# Patient Record
Sex: Female | Born: 1995 | Race: White | Hispanic: No | Marital: Single | Smoking: Never smoker
Health system: Southern US, Community
[De-identification: ages and names within clinical notes are randomized; demographics above are authoritative.]

## PROBLEM LIST (undated history)

## (undated) DIAGNOSIS — D509 Iron deficiency anemia, unspecified: Secondary | ICD-10-CM

## (undated) DIAGNOSIS — R7881 Bacteremia: Secondary | ICD-10-CM

## (undated) DIAGNOSIS — A419 Sepsis, unspecified organism: Secondary | ICD-10-CM

## (undated) DIAGNOSIS — H209 Unspecified iridocyclitis: Secondary | ICD-10-CM

## (undated) DIAGNOSIS — J189 Pneumonia, unspecified organism: Secondary | ICD-10-CM

## (undated) DIAGNOSIS — E871 Hypo-osmolality and hyponatremia: Secondary | ICD-10-CM

## (undated) DIAGNOSIS — E43 Unspecified severe protein-calorie malnutrition: Secondary | ICD-10-CM

## (undated) DIAGNOSIS — Z87898 Personal history of other specified conditions: Secondary | ICD-10-CM

## (undated) DIAGNOSIS — J939 Pneumothorax, unspecified: Secondary | ICD-10-CM

## (undated) DIAGNOSIS — J9600 Acute respiratory failure, unspecified whether with hypoxia or hypercapnia: Secondary | ICD-10-CM

## (undated) DIAGNOSIS — B370 Candidal stomatitis: Secondary | ICD-10-CM

## (undated) HISTORY — DX: Sepsis, unspecified organism: A41.9

## (undated) HISTORY — DX: Bacteremia: R78.81

## (undated) HISTORY — DX: Hypo-osmolality and hyponatremia: E87.1

## (undated) HISTORY — DX: Iron deficiency anemia, unspecified: D50.9

## (undated) HISTORY — DX: Pneumothorax, unspecified: J93.9

## (undated) HISTORY — DX: Personal history of other specified conditions: Z87.898

## (undated) HISTORY — DX: Pneumonia, unspecified organism: J18.9

## (undated) HISTORY — DX: Candidal stomatitis: B37.0

## (undated) HISTORY — DX: Acute respiratory failure, unspecified whether with hypoxia or hypercapnia: J96.00

## (undated) HISTORY — PX: CHEST TUBE INSERTION: SHX231

## (undated) HISTORY — DX: Unspecified severe protein-calorie malnutrition: E43

## (undated) HISTORY — PX: WISDOM TOOTH EXTRACTION: SHX21

---
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
## 2014-02-24 DIAGNOSIS — E871 Hypo-osmolality and hyponatremia: Secondary | ICD-10-CM | POA: Insufficient documentation

## 2014-02-24 DIAGNOSIS — E43 Unspecified severe protein-calorie malnutrition: Secondary | ICD-10-CM | POA: Insufficient documentation

## 2014-02-24 DIAGNOSIS — B370 Candidal stomatitis: Secondary | ICD-10-CM

## 2014-02-24 DIAGNOSIS — Z8639 Personal history of other endocrine, nutritional and metabolic disease: Secondary | ICD-10-CM

## 2014-02-24 DIAGNOSIS — R7881 Bacteremia: Secondary | ICD-10-CM

## 2014-02-24 DIAGNOSIS — J9600 Acute respiratory failure, unspecified whether with hypoxia or hypercapnia: Secondary | ICD-10-CM

## 2014-02-24 DIAGNOSIS — A419 Sepsis, unspecified organism: Secondary | ICD-10-CM

## 2014-02-24 DIAGNOSIS — J189 Pneumonia, unspecified organism: Secondary | ICD-10-CM

## 2014-02-24 DIAGNOSIS — Z87898 Personal history of other specified conditions: Secondary | ICD-10-CM

## 2014-02-24 DIAGNOSIS — D509 Iron deficiency anemia, unspecified: Secondary | ICD-10-CM | POA: Insufficient documentation

## 2014-02-24 HISTORY — DX: Candidal stomatitis: B37.0

## 2014-02-24 HISTORY — DX: Pneumonia, unspecified organism: J18.9

## 2014-02-24 HISTORY — DX: Bacteremia: R78.81

## 2014-02-24 HISTORY — DX: Unspecified severe protein-calorie malnutrition: E43

## 2014-02-24 HISTORY — DX: Personal history of other endocrine, nutritional and metabolic disease: Z86.39

## 2014-02-24 HISTORY — DX: Hypo-osmolality and hyponatremia: E87.1

## 2014-02-24 HISTORY — DX: Sepsis, unspecified organism: A41.9

## 2014-02-24 HISTORY — DX: Acute respiratory failure, unspecified whether with hypoxia or hypercapnia: J96.00

## 2014-02-24 HISTORY — DX: Iron deficiency anemia, unspecified: D50.9

## 2014-02-24 HISTORY — DX: Personal history of other specified conditions: Z87.898

## 2014-03-15 ENCOUNTER — Encounter: Payer: Self-pay | Admitting: Licensed Clinical Social Worker

## 2014-03-15 ENCOUNTER — Ambulatory Visit (INDEPENDENT_AMBULATORY_CARE_PROVIDER_SITE_OTHER): Payer: 59 | Admitting: Internal Medicine

## 2014-03-15 VITALS — BP 112/79 | HR 116 | Temp 97.5°F | Wt 91.8 lb

## 2014-03-15 DIAGNOSIS — B9562 Methicillin resistant Staphylococcus aureus infection as the cause of diseases classified elsewhere: Secondary | ICD-10-CM

## 2014-03-15 DIAGNOSIS — R7881 Bacteremia: Secondary | ICD-10-CM

## 2014-03-15 NOTE — Progress Notes (Signed)
Patient ID: Anna MaeCatherine P Stenzel, female   DOB: 07/20/1995, 18 y.o.   MRN: [id number]         [facility]  Reason for Consult: MRSA pneumonia and bacteremia Referring Physician: Dr. Valma CavaPhilip Yeon  Patient Active Problem List   Diagnosis Date Noted  . Pneumonia 02/24/2014    Priority: High  . MRSA bacteremia 02/24/2014    Priority: High  . Hx of cystic fibrosis 02/24/2014    Priority: Medium  . Microcytic anemia 02/24/2014    Patient's Medications  New Prescriptions   No medications on file  Previous Medications   ALBUTEROL (PROVENTIL HFA;VENTOLIN HFA) 108 (90 BASE) MCG/ACT INHALER    Inhale 2 puffs into the lungs 2 (two) times daily.   AMYLASE-LIPASE-PROTEASE (LIPRAM-CR10) 33.06-30-35.5 MU PER CAPSULE    Take 4 capsules by mouth 3 (three) times daily with meals.   ASCORBIC ACID (VITAMIN C PO)    Take by mouth as directed.   AZITHROMYCIN (ZITHROMAX) 500 MG TABLET    Take 1,000 mg by mouth 3 (three) times a week.   CHOLECALCIFEROL (VITAMIN D PO)    Take by mouth as directed.   DIFLUPREDNATE (DUREZOL) 0.05 % EMUL    Administer 1 drop to both eyes every other day. Frequency:QOD   Dosage:0.0     Instructions:  Note:Dose: 0.05 % BOTH EYES   DORNASE ALPHA (PULMOZYME) 1 MG/ML NEBULIZER SOLUTION    Take 2.5 mg by nebulization daily.   NYSTATIN (MYCOSTATIN) 100000 UNIT/ML SUSPENSION    Use as directed 5 mLs in the mouth or throat 4 (four) times daily. Swish and swallow   PANCRELIPASE, LIP-PROT-AMYL, (CREON PO)    Take by mouth as directed.   PEDIATRIC MULTIPLE VIT-C-FA (PEDIATRIC MULTIVITAMIN) CHEWABLE TABLET    Chew 1 tablet by mouth daily.   RIFAMPIN (RIFADIN) 300 MG CAPSULE    Take 300 mg by mouth 2 (two) times daily. For 4 weeks   SODIUM CHLORIDE HYPERTONIC 3 % NEBULIZER SOLUTION    Take 5 mLs by nebulization 2 (two) times daily.   TOBRAMYCIN IN    Inhale into the lungs.   VANCOMYCIN 1,000 MG IN SODIUM CHLORIDE 0.9 % 250 ML    Inject 1,000 mg into the vein  every 12 (twelve) hours. For 4 weeks.   VITAMIN A PO    Take by mouth as directed.   VITAMIN E PO    Take by mouth as directed.   VITAMIN K PO    Take by mouth as directed.  Modified Medications   No medications on file  Discontinued Medications   No medications on file    Recommendations: 1. Continue vancomycin and rifampin 2 more days then have the PICC removed 2. She may return to school 03/21/14   Assessment: She has improved on therapy for MRSA pneumonia and bacteremia. She will complete therapy on 03/17/14 and may return to school.   HPI: Anna Harper is a 18 y.o. female with cystic fibrosis who developed progressive weakness and subjective fevers in late September leading to admission to the hospital on 02[place]17/14 [place] she is at [facility] at [employer]. She was found to have pneumonia by CXR and CT and MRSA bacteremia. It appears that repeat BC were negative and TEE did not reveal evidence of endocarditis. She was discharged on 02/24/14 on IV vancomycin and oral rifampin. She has tolerated her antibiotics and PICC well and is feeling much better (back to her baseline).   Review  of Systems: Constitutional: positive for fevers, negative for anorexia, chills, sweats and weight loss Eyes: negative Ears, nose, mouth, throat, and face: negative Respiratory: positive for mild worsening of cough when she was first sick, negative for dyspnea on exertion and pleurisy/chest pain Cardiovascular: negative Gastrointestinal: negative Genitourinary:negative Integument/breast: negative    Past Medical History  Diagnosis Date  . Uveitis   . Cystic fibrosis with pulmonary manifestations   . Pneumonia 02/24/14    MULTIFOCAL  . Sepsis due to pneumonia 02/24/14  . MRSA bacteremia 02/24/14  . Hx of cystic fibrosis 02/24/14  . Acute respiratory failure 02/24/14  . Hyponatremia 02/24/14  . Severe protein-calorie malnutrition 02/24/14  . Microcytic anemia 02/24/14  . Oral thrush  02/24/14  . Pneumothorax, left     History  Substance Use Topics  . Smoking status: Not on file  . Smokeless tobacco: Not on file  . Alcohol Use:     No family history on file. No Known Allergies  OBJECTIVE: Blood pressure 112/79, pulse 116, temperature 97.5 F (36.4 C), temperature source Oral, weight 91 lb 12 oz (41.618 kg). General: thin female in no distress Skin: no rash Lungs: clear Cor: tachycardic but reg S1 and S2 without murmurs Abdomen: soft and nontender   Microbiology: No results found for this or any previous visit (from the past 240 hour(s)).  Cliffton AstersJohn Shatonia Hoots, MD [facility] [phone] pager   [phone] cell 03/15/2014, 11:37 AM

## 2015-12-12 ENCOUNTER — Encounter (HOSPITAL_COMMUNITY): Payer: Self-pay | Admitting: Emergency Medicine

## 2015-12-12 ENCOUNTER — Inpatient Hospital Stay (HOSPITAL_COMMUNITY)
Admission: EM | Admit: 2015-12-12 | Discharge: 2015-12-18 | DRG: 871 | Disposition: A | Discharge status: Home Health | Payer: 59 | Attending: Internal Medicine | Admitting: Internal Medicine

## 2015-12-12 ENCOUNTER — Emergency Department (HOSPITAL_COMMUNITY): Payer: 59

## 2015-12-12 DIAGNOSIS — D509 Iron deficiency anemia, unspecified: Secondary | ICD-10-CM | POA: Diagnosis present

## 2015-12-12 DIAGNOSIS — Z681 Body mass index (BMI) 19 or less, adult: Secondary | ICD-10-CM

## 2015-12-12 DIAGNOSIS — R0602 Shortness of breath: Secondary | ICD-10-CM | POA: Diagnosis not present

## 2015-12-12 DIAGNOSIS — A419 Sepsis, unspecified organism: Secondary | ICD-10-CM | POA: Diagnosis not present

## 2015-12-12 DIAGNOSIS — J690 Pneumonitis due to inhalation of food and vomit: Secondary | ICD-10-CM | POA: Diagnosis present

## 2015-12-12 DIAGNOSIS — F419 Anxiety disorder, unspecified: Secondary | ICD-10-CM | POA: Diagnosis present

## 2015-12-12 DIAGNOSIS — E43 Unspecified severe protein-calorie malnutrition: Secondary | ICD-10-CM | POA: Diagnosis present

## 2015-12-12 DIAGNOSIS — B370 Candidal stomatitis: Secondary | ICD-10-CM | POA: Diagnosis present

## 2015-12-12 DIAGNOSIS — J189 Pneumonia, unspecified organism: Secondary | ICD-10-CM

## 2015-12-12 DIAGNOSIS — J9601 Acute respiratory failure with hypoxia: Secondary | ICD-10-CM | POA: Diagnosis present

## 2015-12-12 DIAGNOSIS — Z79899 Other long term (current) drug therapy: Secondary | ICD-10-CM

## 2015-12-12 LAB — CBC WITH DIFFERENTIAL/PLATELET
BASOS ABS: 0 10*3/uL (ref 0.0–0.1)
Basophils Relative: 0 %
EOS ABS: 0 10*3/uL (ref 0.0–0.7)
Eosinophils Relative: 0 %
HCT: 39.8 % (ref 36.0–46.0)
HEMOGLOBIN: 12 g/dL (ref 12.0–15.0)
LYMPHS PCT: 14 %
Lymphs Abs: 1.9 10*3/uL (ref 0.7–4.0)
MCH: 20.9 pg — ABNORMAL LOW (ref 26.0–34.0)
MCHC: 30.2 g/dL (ref 30.0–36.0)
MCV: 69.2 fL — ABNORMAL LOW (ref 78.0–100.0)
MONO ABS: 1.4 10*3/uL — AB (ref 0.1–1.0)
Monocytes Relative: 10 %
NEUTROS PCT: 76 %
Neutro Abs: 10.5 10*3/uL — ABNORMAL HIGH (ref 1.7–7.7)
PLATELETS: ADEQUATE 10*3/uL (ref 150–400)
RBC: 5.75 MIL/uL — ABNORMAL HIGH (ref 3.87–5.11)
RDW: 16.8 % — ABNORMAL HIGH (ref 11.5–15.5)
SMEAR REVIEW: ADEQUATE
WBC: 13.8 10*3/uL — AB (ref 4.0–10.5)

## 2015-12-12 LAB — COMPREHENSIVE METABOLIC PANEL
ALBUMIN: 3.3 g/dL — AB (ref 3.5–5.0)
ALK PHOS: 83 U/L (ref 38–126)
ALT: 13 U/L — AB (ref 14–54)
ANION GAP: 11 (ref 5–15)
AST: 19 U/L (ref 15–41)
BUN: <5 mg/dL — ABNORMAL LOW (ref 6–20)
CALCIUM: 9.3 mg/dL (ref 8.9–10.3)
CHLORIDE: 96 mmol/L — AB (ref 101–111)
CO2: 25 mmol/L (ref 22–32)
CREATININE: 0.55 mg/dL (ref 0.44–1.00)
GFR calc Af Amer: >60 mL/min (ref >60)
GFR calc non Af Amer: >60 mL/min (ref >60)
GLUCOSE: 150 mg/dL — AB (ref 65–99)
Potassium: 4 mmol/L (ref 3.5–5.1)
SODIUM: 132 mmol/L — AB (ref 135–145)
Total Bilirubin: 0.4 mg/dL (ref 0.3–1.2)
Total Protein: 9 g/dL — ABNORMAL HIGH (ref 6.5–8.1)

## 2015-12-12 LAB — I-STAT CG4 LACTIC ACID, ED: Lactic Acid, Venous: 1.45 mmol/L (ref 0.5–1.9)

## 2015-12-12 MED ORDER — VANCOMYCIN HCL IN DEXTROSE 1-5 GM/200ML-% IV SOLN
1000.0000 mg | Freq: Once | INTRAVENOUS | Status: AC
  Administered 2015-12-13: 1000 mg via INTRAVENOUS
  Filled 2015-12-12: qty 200

## 2015-12-12 MED ORDER — SODIUM CHLORIDE 0.9 % IV BOLUS (SEPSIS)
500.0000 mL | Freq: Once | INTRAVENOUS | Status: AC
  Administered 2015-12-13: 500 mL via INTRAVENOUS

## 2015-12-12 MED ORDER — SODIUM CHLORIDE 0.9 % IV BOLUS (SEPSIS): 1000.0000 mL | Freq: Once | INTRAVENOUS | Status: DC

## 2015-12-12 MED ORDER — SODIUM CHLORIDE 0.9 % IV BOLUS (SEPSIS)
1000.0000 mL | Freq: Once | INTRAVENOUS | Status: AC
  Administered 2015-12-12: 1000 mL via INTRAVENOUS

## 2015-12-12 MED ORDER — DEXTROSE 5 % IV SOLN
2.0000 g | Freq: Once | INTRAVENOUS | Status: AC
  Administered 2015-12-13: 2 g via INTRAVENOUS
  Filled 2015-12-12: qty 2

## 2015-12-12 NOTE — ED Notes (Signed)
RT contacted for Sputum culture

## 2015-12-12 NOTE — ED Notes (Signed)
Provider at bedside

## 2015-12-12 NOTE — ED Triage Notes (Signed)
Mother reported fever today with emesis , left lateral ribcage " tightness" and  occasional productive cough . HR= 155/min at arrival.

## 2015-12-12 NOTE — ED Notes (Signed)
Pt had wisdom teeth removed on Wednesday, since removal, has had worsening shortness of breath with productive cough, L sided chest tightness, and fever. Hx of cystic fibrosis with "cultures of pseudomonas pneumonia" per mother. Pt has green phlegm with crackles in lower lobes. Sats 89%, which improve when coughing to 94%. Pt also reports anxiety with a "baseline heart rate of 130 when I'm in the hospital." Pt attempted to take tylenol pta, vomited medication up

## 2015-12-12 NOTE — ED Provider Notes (Signed)
[facility]-EMERGENCY DEPT Provider Note   CSN: [id number] Arrival date & time: 12/12/15  2127  First Provider Contact:  None    By signing my name below, I, Bethel Born, attest that this documentation has been prepared under the direction and in the presence of Derwood Kaplan, MD. Electronically Signed: Bethel Born, ED Scribe. 12/12/15. 11:40 PM   History   Chief Complaint Chief Complaint  Patient presents with  . Fever  . Emesis  . Chest Pain    HPI  The history is provided by the patient and a parent. No language interpreter was used.   Anna Harper is a 20 y.o. female with PMHx of cystic fibrosis who presents to the Emergency Department complaining of constant,  tight, left lateral chest pain with onset today. Pt had 4 wisdom teeth removed 6 days ago under light anaesthesia and her mother reports an intermittent low-grade fever since.  Associated symptoms include SOB, emesis after taking Tylenol and increased cough productive of green sputum. She used a breathing treatment at home with some improvement in congestion/SOB. She also took 500 mg of prophylactic azithromycin.  She was last admitted to the hospital 16 months ago and has not required any other abx since.  No history of DVT/PE. Pt denies hormonal therapy. Her pulmonology care is at [facility].   Past Medical History:  Diagnosis Date  . Acute respiratory failure (HCC) 02/24/14  . Cystic fibrosis with pulmonary manifestations (HCC)   . Hx of cystic fibrosis 02/24/14  . Hyponatremia 02/24/14  . Microcytic anemia 02/24/14  . MRSA bacteremia 02/24/14  . Oral thrush 02/24/14  . Pneumonia 02/24/14   MULTIFOCAL  . Pneumothorax, left   . Sepsis due to pneumonia (HCC) 02/24/14  . Severe protein-calorie malnutrition (HCC) 02/24/14  . Uveitis     Patient Active Problem List   Diagnosis Date Noted  . Pneumonia 02/24/2014  . MRSA bacteremia 02/24/2014  . Hx of cystic fibrosis 02/24/2014  . Microcytic anemia 02/24/2014     History reviewed. No pertinent surgical history.  OB History    No data available       Home Medications    Prior to Admission medications   Medication Sig Start Date End Date Taking? Authorizing Provider  albuterol (PROVENTIL HFA;VENTOLIN HFA) 108 (90 BASE) MCG/ACT inhaler Inhale 2 puffs into the lungs 2 (two) times daily.    Historical Provider, MD  amylase-lipase-protease (LIPRAM-CR10) 33.06-30-35.5 MU per capsule Take 4 capsules by mouth 3 (three) times daily with meals.    Historical Provider, MD  Ascorbic Acid (VITAMIN C PO) Take by mouth as directed.    Historical Provider, MD  azithromycin (ZITHROMAX) 500 MG tablet Take 1,000 mg by mouth 3 (three) times a week.    Historical Provider, MD  Cholecalciferol (VITAMIN D PO) Take by mouth as directed.    Historical Provider, MD  Difluprednate (DUREZOL) 0.05 % EMUL Administer 1 drop to both eyes every other day. Frequency:QOD   Dosage:0.0     Instructions:  Note:Dose: 0.05 % BOTH EYES 07/17/12   Historical Provider, MD  dornase alpha (PULMOZYME) 1 MG/ML nebulizer solution Take 2.5 mg by nebulization daily.    Historical Provider, MD  nystatin (MYCOSTATIN) 100000 UNIT/ML suspension Use as directed 5 mLs in the mouth or throat 4 (four) times daily. Swish and swallow    Historical Provider, MD  Pancrelipase, Lip-Prot-Amyl, (CREON PO) Take by mouth as directed.    Historical Provider, MD  Pediatric Multiple Vit-C-FA (PEDIATRIC MULTIVITAMIN) chewable tablet Chew  1 tablet by mouth daily.    Historical Provider, MD  sodium chloride HYPERTONIC 3 % nebulizer solution Take 5 mLs by nebulization 2 (two) times daily.    Historical Provider, MD  TOBRAMYCIN IN Inhale into the lungs.    Historical Provider, MD  VITAMIN A PO Take by mouth as directed.    Historical Provider, MD  VITAMIN E PO Take by mouth as directed.    Historical Provider, MD  VITAMIN K PO Take by mouth as directed.    Historical Provider, MD    Family History No family history  on file.  Social History Social History  Substance Use Topics  . Smoking status: Never Smoker  . Smokeless tobacco: Not on file  . Alcohol use No     Allergies   Review of patient's allergies indicates no known allergies.   Review of Systems Review of Systems  10 Systems reviewed and all are negative for acute change except as noted in the HPI.   Physical Exam Updated Vital Signs BP 110/74   Pulse (!) 136   Temp 99 F (37.2 C) (Oral)   Resp 19   Ht  (1.626 m)   Wt 94 lb 5 oz (42.8 kg)   SpO2 90%   BMI 16.19 kg/m   Physical Exam  Constitutional: She is oriented to person, place, and time. She appears well-developed and well-nourished. No distress.  HENT:  Head: Normocephalic and atraumatic.  Eyes: EOM are normal.  Neck: Normal range of motion.  Cardiovascular: Regular rhythm and normal heart sounds.  Tachycardia present.   Pulmonary/Chest: Effort normal. She has rales.  Inspiratory rales bilaterally (L>R)  Abdominal: Soft. She exhibits no distension. There is no tenderness.  Musculoskeletal: Normal range of motion.  Neurological: She is alert and oriented to person, place, and time.  Skin: Skin is warm and dry.  Psychiatric: She has a normal mood and affect. Judgment normal.  Nursing note and vitals reviewed.    ED Treatments / Results  Labs (all labs ordered are listed, but only abnormal results are displayed) Labs Reviewed  CBC WITH DIFFERENTIAL/PLATELET - Abnormal; Notable for the following:       Result Value   WBC 13.8 (*)    RBC 5.75 (*)    MCV 69.2 (*)    MCH 20.9 (*)    RDW 16.8 (*)    Neutro Abs 10.5 (*)    Monocytes Absolute 1.4 (*)    All other components within normal limits  COMPREHENSIVE METABOLIC PANEL - Abnormal; Notable for the following:    Sodium 132 (*)    Chloride 96 (*)    Glucose, Bld 150 (*)    BUN <5 (*)    Total Protein 9.0 (*)    Albumin 3.3 (*)    ALT 13 (*)    All other components within normal limits   CULTURE, BLOOD (ROUTINE X 2)  CULTURE, BLOOD (ROUTINE X 2)  URINE CULTURE  CULTURE, EXPECTORATED SPUTUM-ASSESSMENT  I-STAT CG4 LACTIC ACID, ED  POC URINE PREG, ED    EKG  EKG Interpretation  Date/Time:  Tuesday December 12 2015 22:03:45 EDT Ventricular Rate:  140 PR Interval:  126 QRS Duration: 80 QT Interval:  260 QTC Calculation: 396 R Axis:   91 Text Interpretation:  Sinus tachycardia Right atrial enlargement Rightward axis Nonspecific T wave abnormality Abnormal ECG No old tracing to compare Confirmed by Rhunette Croft, MD, Janey Genta [phone]) on 12/12/2015 11:11:05 PM       Radiology  Dg Chest 2 View  Result Date: 12/12/2015 CLINICAL DATA:  20 year old female with fever cough and left-sided chest pain. History of cystic fibrosis. EXAM: CHEST  2 VIEW COMPARISON:  Chest radiograph dated 11/16/2011 FINDINGS: There are chronic interstitial coarsening and prominence with areas of the cystic changes throughout the lungs compatible with chronic changes of cystic fibrosis. There has been interval increased in the diffuse interstitial coarsening and bronchiectatic changes are with wall thickened walls compared to the prior study. Superimposed pneumonia is not excluded. Clinical correlation is recommended. There is no focal consolidation. No pleural effusion or pneumothorax noted. The cardiac silhouette is within normal limits. No acute osseous pathology identified. IMPRESSION: Chronic changes of cystic fibrosis with interval progression of interstitial scarring and bronchiectatic changes compared to the prior study. No focal consolidation. Electronically Signed   By: Elgie Collard M.D.   On: 12/12/2015 22:37   Procedures Procedures (including critical care time)  Medications Ordered in ED Medications  sodium chloride 0.9 % bolus 500 mL (not administered)  ceFEPIme (MAXIPIME) 2 g in dextrose 5 % 50 mL IVPB (not administered)  vancomycin (VANCOCIN) IVPB 1000 mg/200 mL premix (not administered)   sodium chloride 0.9 % bolus 1,000 mL (1,000 mLs Intravenous New Bag/Given 12/12/15 2237)     Initial Impression / Assessment and Plan / ED Course  I have reviewed the triage vital signs and the nursing notes.  COORDINATION OF CARE: 11:21 PM Discussed treatment plan which includes lab work, CXR, EKG, abx, and admission to the hospital with pt at bedside and pt agreed to plan.  Pertinent labs & imaging results that were available during my care of the patient were reviewed by me and considered in my medical decision making (see chart for details).  Clinical Course    I personally performed the services described in this documentation, which was scribed in my presence. The recorded information has been reviewed and is accurate.   Pt comes in with cc of cough and L sided chest tightness. She has hx of CF. Uptodate with immunization and takes 500 mg azithromycin tid. 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 Pt reports recent dental extraction, for which she received moderate sedation. Pt has chronic cough, but the cough has gotten worse, and today she had L sided chest tightness, so she decided to come to the ER. Low grade temp at home 99-100 degrees. Pt is noted to be tachycardic, tachypneic and has WC of 14K. She has slightly decreased O2 sats. Clinical concerns for PNA, although xrays nt picking up. We will give her vanc and cefepime to cover  MRSA and pseudomonas. Lactate is 1.4.  Final Clinical Impressions(s) / ED Diagnoses   Final diagnoses:  CAP (community acquired pneumonia)  Sepsis, due to unspecified organism [facility])    New Prescriptions New Prescriptions   No medications on file      Derwood Kaplan, MD 12/12/15 2345

## 2015-12-13 ENCOUNTER — Encounter (HOSPITAL_COMMUNITY): Payer: Self-pay | Admitting: Internal Medicine

## 2015-12-13 DIAGNOSIS — J9601 Acute respiratory failure with hypoxia: Secondary | ICD-10-CM | POA: Diagnosis present

## 2015-12-13 DIAGNOSIS — Z681 Body mass index (BMI) 19 or less, adult: Secondary | ICD-10-CM | POA: Diagnosis not present

## 2015-12-13 DIAGNOSIS — J189 Pneumonia, unspecified organism: Secondary | ICD-10-CM | POA: Diagnosis not present

## 2015-12-13 DIAGNOSIS — B370 Candidal stomatitis: Secondary | ICD-10-CM | POA: Diagnosis present

## 2015-12-13 DIAGNOSIS — A419 Sepsis, unspecified organism: Secondary | ICD-10-CM | POA: Diagnosis present

## 2015-12-13 DIAGNOSIS — Z87898 Personal history of other specified conditions: Secondary | ICD-10-CM

## 2015-12-13 DIAGNOSIS — J9600 Acute respiratory failure, unspecified whether with hypoxia or hypercapnia: Secondary | ICD-10-CM | POA: Insufficient documentation

## 2015-12-13 DIAGNOSIS — R6510 Systemic inflammatory response syndrome (SIRS) of non-infectious origin without acute organ dysfunction: Secondary | ICD-10-CM | POA: Diagnosis not present

## 2015-12-13 DIAGNOSIS — J690 Pneumonitis due to inhalation of food and vomit: Secondary | ICD-10-CM | POA: Diagnosis not present

## 2015-12-13 DIAGNOSIS — E43 Unspecified severe protein-calorie malnutrition: Secondary | ICD-10-CM | POA: Diagnosis not present

## 2015-12-13 DIAGNOSIS — F419 Anxiety disorder, unspecified: Secondary | ICD-10-CM | POA: Diagnosis present

## 2015-12-13 DIAGNOSIS — D509 Iron deficiency anemia, unspecified: Secondary | ICD-10-CM | POA: Diagnosis not present

## 2015-12-13 DIAGNOSIS — R0602 Shortness of breath: Secondary | ICD-10-CM | POA: Diagnosis present

## 2015-12-13 DIAGNOSIS — Z79899 Other long term (current) drug therapy: Secondary | ICD-10-CM | POA: Diagnosis not present

## 2015-12-13 LAB — RESPIRATORY PANEL BY PCR
ADENOVIRUS-RVPPCR: NOT DETECTED
BORDETELLA PERTUSSIS-RVPCR: NOT DETECTED
CORONAVIRUS 229E-RVPPCR: NOT DETECTED
Chlamydophila pneumoniae: NOT DETECTED
Coronavirus HKU1: NOT DETECTED
Coronavirus NL63: NOT DETECTED
Coronavirus OC43: NOT DETECTED
INFLUENZA A H1 2009-RVPPR: NOT DETECTED
INFLUENZA A H1-RVPPCR: NOT DETECTED
Influenza A H3: NOT DETECTED
Influenza A: NOT DETECTED
Influenza B: NOT DETECTED
MYCOPLASMA PNEUMONIAE-RVPPCR: NOT DETECTED
Metapneumovirus: NOT DETECTED
Parainfluenza Virus 1: NOT DETECTED
Parainfluenza Virus 2: NOT DETECTED
Parainfluenza Virus 3: NOT DETECTED
Parainfluenza Virus 4: NOT DETECTED
RESPIRATORY SYNCYTIAL VIRUS-RVPPCR: NOT DETECTED
Rhinovirus / Enterovirus: NOT DETECTED

## 2015-12-13 LAB — APTT: aPTT: 40 seconds — ABNORMAL HIGH (ref 24–36)

## 2015-12-13 LAB — LACTIC ACID, PLASMA
Lactic Acid, Venous: 1 mmol/L (ref 0.5–1.9)
Lactic Acid, Venous: 0.7 mmol/L (ref 0.5–1.9)

## 2015-12-13 LAB — HIV ANTIBODY (ROUTINE TESTING W REFLEX): HIV SCREEN 4TH GENERATION: NONREACTIVE

## 2015-12-13 LAB — PREGNANCY, URINE: PREG TEST UR: NEGATIVE

## 2015-12-13 LAB — PROCALCITONIN: Procalcitonin: 0.18 ng/mL

## 2015-12-13 LAB — PROTIME-INR
INR: 1.38
Prothrombin Time: 17.1 seconds — ABNORMAL HIGH (ref 11.4–15.2)

## 2015-12-13 LAB — STREP PNEUMONIAE URINARY ANTIGEN: STREP PNEUMO URINARY ANTIGEN: NEGATIVE

## 2015-12-13 LAB — MRSA PCR SCREENING: MRSA by PCR: POSITIVE — AB

## 2015-12-13 MED ORDER — SODIUM CHLORIDE 7 % IN NEBU
1.0000 | INHALATION_SOLUTION | Freq: Two times a day (BID) | RESPIRATORY_TRACT | Status: DC
  Administered 2015-12-15 (×2): via RESPIRATORY_TRACT
  Administered 2015-12-16: 7 via RESPIRATORY_TRACT
  Administered 2015-12-16: 21:00:00 via RESPIRATORY_TRACT
  Administered 2015-12-17: 7 % via RESPIRATORY_TRACT
  Administered 2015-12-17: 20:00:00 via RESPIRATORY_TRACT
  Administered 2015-12-18: 1 via RESPIRATORY_TRACT
  Filled 2015-12-13 (×6): qty 1

## 2015-12-13 MED ORDER — CHLORHEXIDINE GLUCONATE CLOTH 2 % EX PADS
6.0000 | MEDICATED_PAD | Freq: Every day | CUTANEOUS | Status: AC
  Administered 2015-12-14 – 2015-12-18 (×5): 6 via TOPICAL

## 2015-12-13 MED ORDER — MUPIROCIN 2 % EX OINT
1.0000 "application " | TOPICAL_OINTMENT | Freq: Two times a day (BID) | CUTANEOUS | Status: AC
  Administered 2015-12-13 – 2015-12-18 (×10): 1 via NASAL
  Filled 2015-12-13 (×2): qty 22

## 2015-12-13 MED ORDER — TOBRAMYCIN 300 MG/5ML IN NEBU
300.0000 mg | INHALATION_SOLUTION | Freq: Two times a day (BID) | RESPIRATORY_TRACT | Status: DC
  Administered 2015-12-13 – 2015-12-15 (×6): 300 mg via RESPIRATORY_TRACT
  Filled 2015-12-13 (×10): qty 5

## 2015-12-13 MED ORDER — PANTOPRAZOLE SODIUM 40 MG PO TBEC
40.0000 mg | DELAYED_RELEASE_TABLET | Freq: Every day | ORAL | Status: DC
Start: 2015-12-13 — End: 2015-12-18
  Administered 2015-12-13 – 2015-12-18 (×6): 40 mg via ORAL
  Filled 2015-12-13 (×7): qty 1

## 2015-12-13 MED ORDER — AZITHROMYCIN 250 MG PO TABS
500.0000 mg | ORAL_TABLET | ORAL | Status: DC
  Administered 2015-12-13 – 2015-12-15 (×2): 500 mg via ORAL
  Filled 2015-12-13 (×2): qty 2

## 2015-12-13 MED ORDER — SODIUM CHLORIDE 0.9 % IV BOLUS (SEPSIS)
500.0000 mL | Freq: Once | INTRAVENOUS | Status: AC
  Administered 2015-12-14: 500 mL via INTRAVENOUS

## 2015-12-13 MED ORDER — PANCRELIPASE (LIP-PROT-AMYL) 36000-114000 UNITS PO CPEP
72000.0000 [IU] | ORAL_CAPSULE | ORAL | Status: DC | PRN
  Administered 2015-12-14 – 2015-12-16 (×5): 72000 [IU] via ORAL
  Filled 2015-12-13 (×4): qty 2

## 2015-12-13 MED ORDER — SODIUM CHLORIDE 0.9 % IV SOLN
INTRAVENOUS | Status: DC
  Administered 2015-12-13 – 2015-12-17 (×6): via INTRAVENOUS

## 2015-12-13 MED ORDER — ACETAMINOPHEN 325 MG PO TABS
650.0000 mg | ORAL_TABLET | Freq: Four times a day (QID) | ORAL | Status: DC | PRN
  Administered 2015-12-13 – 2015-12-16 (×6): 650 mg via ORAL
  Filled 2015-12-13 (×6): qty 2

## 2015-12-13 MED ORDER — SODIUM CHLORIDE 3 % IN NEBU
4.0000 mL | INHALATION_SOLUTION | Freq: Once | RESPIRATORY_TRACT | Status: AC
  Administered 2015-12-13: 4 mL via RESPIRATORY_TRACT
  Filled 2015-12-13: qty 4

## 2015-12-13 MED ORDER — ENSURE ENLIVE PO LIQD
237.0000 mL | Freq: Two times a day (BID) | ORAL | Status: DC
  Administered 2015-12-13: 237 mL via ORAL

## 2015-12-13 MED ORDER — AMYLASE-LIPASE-PROTEASE 33.2-10-37.5 MU PO CPEP: 4.0000 | ORAL_CAPSULE | Freq: Three times a day (TID) | ORAL | Status: DC

## 2015-12-13 MED ORDER — DM-GUAIFENESIN ER 30-600 MG PO TB12
1.0000 | ORAL_TABLET | Freq: Two times a day (BID) | ORAL | Status: DC
  Administered 2015-12-18: 1 via ORAL
  Filled 2015-12-13 (×7): qty 1

## 2015-12-13 MED ORDER — PIPERACILLIN-TAZOBACTAM 3.375 G IVPB
3.3750 g | Freq: Three times a day (TID) | INTRAVENOUS | Status: DC
  Administered 2015-12-13 – 2015-12-15 (×8): 3.375 g via INTRAVENOUS
  Filled 2015-12-13 (×11): qty 50

## 2015-12-13 MED ORDER — ONDANSETRON HCL 4 MG/2ML IJ SOLN
4.0000 mg | Freq: Three times a day (TID) | INTRAMUSCULAR | Status: DC | PRN
  Administered 2015-12-16: 4 mg via INTRAVENOUS
  Filled 2015-12-13: qty 2

## 2015-12-13 MED ORDER — DORNASE ALFA 2.5 MG/2.5ML IN SOLN
2.5000 mg | Freq: Every day | RESPIRATORY_TRACT | Status: DC
  Administered 2015-12-13 – 2015-12-18 (×6): 2.5 mg via RESPIRATORY_TRACT
  Filled 2015-12-13 (×8): qty 2.5

## 2015-12-13 MED ORDER — PANCRELIPASE (LIP-PROT-AMYL) 36000-114000 UNITS PO CPEP
120000.0000 [IU] | ORAL_CAPSULE | Freq: Three times a day (TID) | ORAL | Status: DC
  Administered 2015-12-13 – 2015-12-17 (×10): 120000 [IU] via ORAL
  Filled 2015-12-13 (×22): qty 3

## 2015-12-13 MED ORDER — ENOXAPARIN SODIUM 30 MG/0.3ML ~~LOC~~ SOLN
30.0000 mg | Freq: Every day | SUBCUTANEOUS | Status: DC
  Administered 2015-12-15: 30 mg via SUBCUTANEOUS
  Filled 2015-12-13 (×3): qty 0.3

## 2015-12-13 MED ORDER — FLUCONAZOLE 100 MG PO TABS
200.0000 mg | ORAL_TABLET | Freq: Every day | ORAL | Status: DC
  Administered 2015-12-13 – 2015-12-18 (×6): 200 mg via ORAL
  Filled 2015-12-13 (×6): qty 2

## 2015-12-13 MED ORDER — VANCOMYCIN HCL IN DEXTROSE 750-5 MG/150ML-% IV SOLN
750.0000 mg | Freq: Three times a day (TID) | INTRAVENOUS | Status: DC
  Administered 2015-12-13 – 2015-12-14 (×4): 750 mg via INTRAVENOUS
  Filled 2015-12-13 (×5): qty 150

## 2015-12-13 MED ORDER — LEVALBUTEROL HCL 1.25 MG/0.5ML IN NEBU
1.2500 mg | INHALATION_SOLUTION | Freq: Four times a day (QID) | RESPIRATORY_TRACT | Status: DC
Start: 2015-12-13 — End: 2015-12-18
  Administered 2015-12-13 – 2015-12-18 (×22): 1.25 mg via RESPIRATORY_TRACT
  Filled 2015-12-13 (×22): qty 0.5

## 2015-12-13 MED ORDER — SODIUM CHLORIDE 3 % IN NEBU
4.0000 mL | INHALATION_SOLUTION | Freq: Two times a day (BID) | RESPIRATORY_TRACT | Status: DC
  Administered 2015-12-13 – 2015-12-15 (×5): 4 mL via RESPIRATORY_TRACT
  Filled 2015-12-13 (×8): qty 4

## 2015-12-13 MED ORDER — SODIUM CHLORIDE 3 % IN NEBU: 5.0000 mL | INHALATION_SOLUTION | Freq: Two times a day (BID) | RESPIRATORY_TRACT | Status: DC

## 2015-12-13 NOTE — H&P (Signed)
History and Physical    Anna Harper POL:[id number] DOB: 24-Dec-1995 DOA: 12/12/2015  Referring MD/NP/PA:   PCP: Maxwell Caul, MD   Patient coming from:  The patient is coming from home.  At baseline, pt is independent for most of ADL.     Chief Complaint: Cough, shortness of breath and fever  HPI: Anna Harper is a 20 y.o. female with medical history significant of cystic fibrosis, pneumothorax, who presents with cough, shortness and chest pain.  Patient reports that she had 4 wisdom teeth removed on Wednesday. Since removal, she developed worsening shortness of breath with productive cough. She coughs up greenish colored sputum. She also has left-sided chest tightness which is mild. She has subjective fever and chills. She vomited twice due to severe coughing. Patient does not have abdominal pain, diarrhea, symptoms of UTI or unilateral weakness. Of note, she states that she always has anxiety with a "baseline heart rate of 130 when I'm in the hospital."   ED Course: pt was found to have WBC 13.8, M2 99, tachycardia, oxygen desaturation to 90% on room air, lactate 1.45, electrolytes and renal function okay. Chest x-ray showed chronic changes of cystic fibrosis with interval progression of interstitial scarring and bronchiectatic changes compared to the prior study, no focal consolidation. Pt is admitted to tele bed as inpt. EDP discussed with pt and her mother about possibility of transferring patient to [facility], but both patient and her mother refused.  Review of Systems:   General: has subjective fevers, chills, no changes in body weight, has fatigue HEENT: no blurry vision, hearing changes or sore throat Pulm: has dyspnea, coughing, no wheezing CV: has chest tightness, no palpitations Abd: had nausea, vomiting, no abdominal pain, diarrhea, constipation GU: no dysuria, burning on urination, increased urinary frequency, hematuria  Ext: no leg edema Neuro: no unilateral  weakness, numbness, or tingling, no vision change or hearing loss Skin: no rash MSK: No muscle spasm, no deformity, no limitation of range of movement in spin Heme: No easy bruising.  Travel history: No recent long distant travel.  Allergy: No Known Allergies  Past Medical History:  Diagnosis Date  . Acute respiratory failure (HCC) 02/24/14  . Cystic fibrosis with pulmonary manifestations (HCC)   . Hx of cystic fibrosis 02/24/14  . Hyponatremia 02/24/14  . Microcytic anemia 02/24/14  . MRSA bacteremia 02/24/14  . Oral thrush 02/24/14  . Pneumonia 02/24/14   MULTIFOCAL  . Pneumothorax, left   . Sepsis due to pneumonia (HCC) 02/24/14  . Severe protein-calorie malnutrition (HCC) 02/24/14  . Uveitis     Past Surgical History:  Procedure Laterality Date  . CHEST TUBE INSERTION Left   . WISDOM TOOTH EXTRACTION      Social History:  reports that she has never smoked. She does not have any smokeless tobacco history on file. She reports that she does not drink alcohol or use drugs.  Family History:  Family History  Problem Relation Age of Onset  . Autoimmune disease Mother   . Valvular heart disease Father   . Cystic fibrosis Sister   . Cystic fibrosis Brother      Prior to Admission medications   Medication Sig Start Date End Date Taking? Authorizing Provider  albuterol (PROVENTIL HFA;VENTOLIN HFA) 108 (90 BASE) MCG/ACT inhaler Inhale 2 puffs into the lungs 2 (two) times daily.    Historical Provider, MD  amylase-lipase-protease (LIPRAM-CR10) 33.06-30-35.5 MU per capsule Take 4 capsules by mouth 3 (three) times daily with meals.  Historical Provider, MD  Ascorbic Acid (VITAMIN C PO) Take by mouth as directed.    Historical Provider, MD  azithromycin (ZITHROMAX) 500 MG tablet Take 1,000 mg by mouth 3 (three) times a week.    Historical Provider, MD  Cholecalciferol (VITAMIN D PO) Take by mouth as directed.    Historical Provider, MD  Difluprednate (DUREZOL) 0.05 % EMUL Administer 1  drop to both eyes every other day. Frequency:QOD   Dosage:0.0     Instructions:  Note:Dose: 0.05 % BOTH EYES 07/17/12   Historical Provider, MD  dornase alpha (PULMOZYME) 1 MG/ML nebulizer solution Take 2.5 mg by nebulization daily.    Historical Provider, MD  nystatin (MYCOSTATIN) 100000 UNIT/ML suspension Use as directed 5 mLs in the mouth or throat 4 (four) times daily. Swish and swallow    Historical Provider, MD  Pancrelipase, Lip-Prot-Amyl, (CREON PO) Take by mouth as directed.    Historical Provider, MD  Pediatric Multiple Vit-C-FA (PEDIATRIC MULTIVITAMIN) chewable tablet Chew 1 tablet by mouth daily.    Historical Provider, MD  sodium chloride HYPERTONIC 3 % nebulizer solution Take 5 mLs by nebulization 2 (two) times daily.    Historical Provider, MD  TOBRAMYCIN IN Inhale into the lungs.    Historical Provider, MD  VITAMIN A PO Take by mouth as directed.    Historical Provider, MD  VITAMIN E PO Take by mouth as directed.    Historical Provider, MD  VITAMIN K PO Take by mouth as directed.    Historical Provider, MD    Physical Exam: Vitals:   12/12/15 2330 12/13/15 0030 12/13/15 0143 12/13/15 0256  BP: 106/72 103/69 106/63   Pulse: (!) 126 115 (!) 115   Resp: 23 20 16    Temp:   98.5 F (36.9 C)   TempSrc:   Oral   SpO2: 90% 92%  96%  Weight:   44.5 kg (98 lb 1.7 oz)   Height:   5\' 4"  (1.626 m)    General: Not in acute distress HEENT:       Eyes: PERRL, EOMI, no scleral icterus.       ENT: No discharge from the ears and nose, no pharynx injection, no tonsillar enlargement.        Neck: No JVD, no bruit, no mass felt. Heme: No neck lymph node enlargement. Cardiac: S1/S2, RRR, No murmurs, No gallops or rubs. Pulm: has rales bilaterally. No wheezing or rubs. Abd: Soft, nondistended, nontender, no rebound pain, no organomegaly, BS present. GU: No hematuria Ext: No pitting leg edema bilaterally. 2+DP/PT pulse bilaterally. Musculoskeletal: No joint deformities, No joint redness or  warmth, no limitation of ROM in spin. Skin: No rashes.  Neuro: Alert, oriented X3, cranial nerves II-XII grossly intact, moves all extremities normally. Psych: Patient is not psychotic, no suicidal or hemocidal ideation.  Labs on Admission: I have personally reviewed following labs and imaging studies  CBC:  Recent Labs Lab 12/12/15 2210  WBC 13.8*  NEUTROABS 10.5*  HGB 12.0  HCT 39.8  MCV 69.2*  PLT PLATELETS APPEAR ADEQUATE   Basic Metabolic Panel:  Recent Labs Lab 12/12/15 2210  NA 132*  K 4.0  CL 96*  CO2 25  GLUCOSE 150*  BUN <5*  CREATININE 0.55  CALCIUM 9.3   GFR: Estimated Creatinine Clearance: 78.1 mL/min (by C-G formula based on SCr of 0.8 mg/dL). Liver Function Tests:  Recent Labs Lab 12/12/15 2210  AST 19  ALT 13*  ALKPHOS 83  BILITOT 0.4  PROT 9.0*  ALBUMIN 3.3*   No results for input(s): LIPASE, AMYLASE in the last 168 hours. No results for input(s): AMMONIA in the last 168 hours. Coagulation Profile:  Recent Labs Lab 12/13/15 0118  INR 1.38   Cardiac Enzymes: No results for input(s): CKTOTAL, CKMB, CKMBINDEX, TROPONINI in the last 168 hours. BNP (last 3 results) No results for input(s): PROBNP in the last 8760 hours. HbA1C: No results for input(s): HGBA1C in the last 72 hours. CBG: No results for input(s): GLUCAP in the last 168 hours. Lipid Profile: No results for input(s): CHOL, HDL, LDLCALC, TRIG, CHOLHDL, LDLDIRECT in the last 72 hours. Thyroid Function Tests: No results for input(s): TSH, T4TOTAL, FREET4, T3FREE, THYROIDAB in the last 72 hours. Anemia Panel: No results for input(s): VITAMINB12, FOLATE, FERRITIN, TIBC, IRON, RETICCTPCT in the last 72 hours. Urine analysis: No results found for: COLORURINE, APPEARANCEUR, LABSPEC, PHURINE, GLUCOSEU, HGBUR, BILIRUBINUR, KETONESUR, PROTEINUR, UROBILINOGEN, NITRITE, LEUKOCYTESUR Sepsis Labs: (procalcitonin:4,lacticidven:4) )No results found for this or any previous  visit (from the past 240 hour(s)).   Radiological Exams on Admission: Dg Chest 2 View  Result Date: 12/12/2015 CLINICAL DATA:  20 year old female with fever cough and left-sided chest pain. History of cystic fibrosis. EXAM: CHEST  2 VIEW COMPARISON:  Chest radiograph dated 11/16/2011 FINDINGS: There are chronic interstitial coarsening and prominence with areas of the cystic changes throughout the lungs compatible with chronic changes of cystic fibrosis. There has been interval increased in the diffuse interstitial coarsening and bronchiectatic changes are with wall thickened walls compared to the prior study. Superimposed pneumonia is not excluded. Clinical correlation is recommended. There is no focal consolidation. No pleural effusion or pneumothorax noted. The cardiac silhouette is within normal limits. No acute osseous pathology identified. IMPRESSION: Chronic changes of cystic fibrosis with interval progression of interstitial scarring and bronchiectatic changes compared to the prior study. No focal consolidation. Electronically Signed   By: Elgie Collard M.D.   On: 12/12/2015 22:37    EKG: Independently reviewed.  Not done in ED, will get one.   Assessment/Plan Principal Problem:   Acute respiratory failure with hypoxia (HCC) Active Problems:   Hx of cystic fibrosis   Microcytic anemia   Sepsis (HCC)   Acute respiratory failure with hypoxia and sepsis: likely due to flare up of CF. Aspiration pneumonia is also possible given recent wisdom tooth removal. Pt meets criteria for sepsis with leukocytosis and tachycardia.  Lactate is normal. Hemodynamically stable.   -will admit to tele bed as inpt -IV cefepime and vanco were started in ED, will switch to vanco and zosyn given possible aspiration. -Sputum and blood cultures - check urine legionella and strep antigen -Mucinex for cough -prn Xopenex nebulizer for shortness of breath -continue home azithromycin and tobramycin  nebulizers -flutter valve -will get Procalcitonin and trend lactic acid levels per sepsis protocol. -IVF: 1.5L of NS bolus in ED, followed by 100 cc/h  -Consider routine pulmonary and infectious disease consults in the AM  Hx of cystic fibrosis: -see above  DVT ppx: SQ Lovenox Code Status: Full code Family Communication: Yes, patient's mother at bed side Disposition Plan:  Anticipate discharge back to previous home environment Consults called:  none Admission status: Inpatient/tele  Date of Service 12/13/2015    Lorretta Harp [facility] Pager [phone]  If 7PM-7AM, please contact night-coverage [email or web address] Password [email or web address] 12/13/2015, 3:04 AM

## 2015-12-13 NOTE — ED Notes (Signed)
Pt placed om 2L [place] oxygen for sats 86%

## 2015-12-13 NOTE — Progress Notes (Signed)
Received report from ED RN. Room ready for patient. Izola Teague Joselita, RN 

## 2015-12-13 NOTE — Progress Notes (Signed)
Pharmacy Antibiotic Note  RINKA JERSEY is a 20 y.o. female with h/o cystic fibrosis admitted on 12/12/2015 with PNA.  Pharmacy has been consulted for Vancomycin and Zosyn dosing.  Vancomycin 1 g IV given in ED at midnight   Plan: Vancomycin 750 mg IV q8h Zosyn 3.375 g IV q8h   Height: 5\' 4"  (162.6 cm) Weight: 98 lb 1.7 oz (44.5 kg) IBW/kg (Calculated) : 54.7  Temp (24hrs), Avg:98.8 F (37.1 C), Min:98.5 F (36.9 C), Max:99 F (37.2 C)   Recent Labs Lab 12/12/15 2210 12/12/15 2220 12/13/15 0118  WBC 13.8*  --   --   CREATININE 0.55  --   --   LATICACIDVEN  --  1.45 1.0    Estimated Creatinine Clearance: 78.1 mL/min (by C-G formula based on SCr of 0.8 mg/dL).    No Known Allergies  Eddie Candle 12/13/2015 3:17 AM

## 2015-12-13 NOTE — Progress Notes (Signed)
PROGRESS NOTE                                                                                                                                                                                                             Patient Demographics:    Anna Harper, is a 20 y.o. female, DOB - 10/19/1995, ZOX:[id number]  Admit date - 12/12/2015   Admitting Physician Lorretta Harp, MD  Outpatient Primary MD for the patient is Maxwell Caul, MD  LOS - 0  Outpatient Specialists: Dr. Camillo Flaming in [facility] cystic fibrosis  Chief Complaint  Patient presents with  . Fever  . Emesis  . Chest Pain       Brief Narrative   20 year old female with history of cystic fibrosis who follows at [facility], history of left pneumothorax and recently had all 4 wisdom tooth removed one week back presented to the ED with productive cough, shortness of breath and chest tightness in the past 3-4 days. She also reported subjective fever with chills. Patient had severe bouts of coughing with 2 episode of vomiting. Denied any headache, abdominal pain, diarrhea, dysuria. Patient was found to have SIRS with sinus tachycardia, WBC of 13.8 and O2 sat of 90% on room air. Remaining labs are stable. Chest excisional chronic cystic fibrosis changes with interval progression with interstitial scarring and bronchitic changes, no focal consolidation. Patient admitted to telemetry.   Subjective:   Patient reports her breathing to be better. Still having some cough. Heart rate in the 130s at rest.   Assessment  & Plan :    Principal Problem:   Acute respiratory failure with hypoxia (HCC) Possibly cystic fibrosis flareup versus aspiration pneumonia from recent wisdom teeth removal. Patient reports coughing and may have aspirated during the procedure. Currently requiring 3 L O2 via nasal cannula. Empiric IV vancomycin and Zosyn. Added azithromycin for atypical  coverage. Blood and sputum cultures sent on admission. Follow urine for strep antigen and Legionella. Added Mucinex. Tylenol when necessary for fever. Resume her home tobramycin nebulizer. Added Xopenex nebs. Resume home medications. If symptoms worsen I will also pulmonary.  Active Problems:   Hx of cystic fibrosis Resume hypertonic normal saline nebulizer solution twice daily. Resumed Creon, sodium chloride inhalant 7% bid. Resume Pulmozyme.  Sinus tachycardia Both patient and her mother report she has significant tachycardia during hospital  visit that potentially is triggered with anxiety. Heart is stable in the 130s. Continue IV hydration and monitor for now.  Oral thrush Added fluconazole     Microcytic anemia Stable.       Code Status : Full code  Family Communication  : Spoke with mother at bedside  Disposition Plan  : Home once clinically improved, possibly in the next 40-72 hours  Barriers For Discharge : Active symptoms  Consults  :  None  Procedures  : None  DVT Prophylaxis  :  Lovenox -   Lab Results  Component Value Date   PLT PLATELETS APPEAR ADEQUATE 12/12/2015    Antibiotics  :   Anti-infectives    Start     Dose/Rate Route Frequency Ordered Stop   12/13/15 1015  fluconazole (DIFLUCAN) tablet 200 mg     200 mg Oral Daily 12/13/15 1005     12/13/15 1000  azithromycin (ZITHROMAX) tablet 500 mg    Comments:  Monday, Wednesday and Friday     500 mg Oral Once per day on Mon Wed Fri 12/13/15 0057     12/13/15 0600  vancomycin (VANCOCIN) IVPB 750 mg/150 ml premix     750 mg 150 mL/hr over 60 Minutes Intravenous Every 8 hours 12/13/15 0321     12/13/15 0600  piperacillin-tazobactam (ZOSYN) IVPB 3.375 g     3.375 g 12.5 mL/hr over 240 Minutes Intravenous Every 8 hours 12/13/15 0321     12/13/15 0300  tobramycin (PF) (TOBI) nebulizer solution 300 mg    Comments:  Take for 30 days then hold for 30.     300 mg Nebulization 2 times daily 12/13/15 0057      12/12/15 2330  ceFEPIme (MAXIPIME) 2 g in dextrose 5 % 50 mL IVPB     2 g 100 mL/hr over 30 Minutes Intravenous  Once 12/12/15 2326 12/13/15 0100   12/12/15 2330  vancomycin (VANCOCIN) IVPB 1000 mg/200 mL premix     1,000 mg 200 mL/hr over 60 Minutes Intravenous  Once 12/12/15 2326 12/13/15 0200        Objective:   Vitals:   12/13/15 0256 12/13/15 0552 12/13/15 0725 12/13/15 0940  BP:  (!) 110/52  (!) 112/52  Pulse:  (!) 120  (!) 136  Resp:  16  18  Temp:  99.2 F (37.3 C)  98.8 F (37.1 C)  TempSrc:    Oral  SpO2: 96% 93% 91% 94%  Weight:      Height:        Wt Readings from Last 3 Encounters:  12/13/15 44.5 kg (98 lb 1.7 oz)  03/15/14 41.6 kg (91 lb 12 oz) (<1 %, Z < -2.33)*  11/16/11 38.4 kg (84 lb 11.2 oz) (<1 %, Z < -2.33)*   * Growth percentiles are based on CDC 2-20 Years data.     Intake/Output Summary (Last 24 hours) at 12/13/15 1518 Last data filed at 12/13/15 4098  Gross per 24 hour  Intake             1750 ml  Output             1050 ml  Net              700 ml     Physical Exam  Gen: not in distress HEENT: no pallor, moist mucosa, Oral thrush, supple neck Chest: Coarse breath sounds bilaterally CVS:  S1&S2 tachycardic, no murmurs, rubs or gallop GI: soft, NT, ND, BS+ Musculoskeletal:  warm, no edema     Data Review:    CBC  Recent Labs Lab 12/12/15 2210  WBC 13.8*  HGB 12.0  HCT 39.8  PLT PLATELETS APPEAR ADEQUATE  MCV 69.2*  MCH 20.9*  MCHC 30.2  RDW 16.8*  LYMPHSABS 1.9  MONOABS 1.4*  EOSABS 0.0  BASOSABS 0.0    Chemistries   Recent Labs Lab 12/12/15 2210  NA 132*  K 4.0  CL 96*  CO2 25  GLUCOSE 150*  BUN <5*  CREATININE 0.55  CALCIUM 9.3  AST 19  ALT 13*  ALKPHOS 83  BILITOT 0.4   ------------------------------------------------------------------------------------------------------------------ No results for input(s): CHOL, HDL, LDLCALC, TRIG, CHOLHDL, LDLDIRECT in the last 72 hours.  No results found  for: HGBA1C ------------------------------------------------------------------------------------------------------------------ No results for input(s): TSH, T4TOTAL, T3FREE, THYROIDAB in the last 72 hours.  Invalid input(s): FREET3 ------------------------------------------------------------------------------------------------------------------ No results for input(s): VITAMINB12, FOLATE, FERRITIN, TIBC, IRON, RETICCTPCT in the last 72 hours.  Coagulation profile  Recent Labs Lab 12/13/15 0118  INR 1.38    No results for input(s): DDIMER in the last 72 hours.  Cardiac Enzymes No results for input(s): CKMB, TROPONINI, MYOGLOBIN in the last 168 hours.  Invalid input(s): CK ------------------------------------------------------------------------------------------------------------------ No results found for: BNP  Inpatient Medications  Scheduled Meds: . azithromycin  500 mg Oral Once per day on Mon Wed Fri  . dextromethorphan-guaiFENesin  1 tablet Oral BID  . dornase alpha  2.5 mg Nebulization Daily  . enoxaparin (LOVENOX) injection  30 mg Subcutaneous Daily  . fluconazole  200 mg Oral Daily  . levalbuterol  1.25 mg Nebulization Q6H  . lipase/protease/amylase (CREON) 120,000 Units  120,000 Units Oral TID WC  . pantoprazole  40 mg Oral Daily  . piperacillin-tazobactam (ZOSYN)  IV  3.375 g Intravenous Q8H  . Sodium Chloride (Inhalant)  1 ampule Inhalation BID  . sodium chloride HYPERTONIC  4 mL Nebulization BID  . tobramycin (PF)  300 mg Nebulization BID  . vancomycin  750 mg Intravenous Q8H   Continuous Infusions: . sodium chloride 100 mL/hr at 12/13/15 1050   PRN Meds:.lipase/protease/amylase, ondansetron  Micro Results Recent Results (from the past 240 hour(s))  MRSA PCR Screening     Status: Abnormal   Collection Time: 12/13/15  2:13 AM  Result Value Ref Range Status   MRSA by PCR POSITIVE (A) NEGATIVE Final    Comment:        The GeneXpert MRSA Assay  (FDA approved for NASAL specimens only), is one component of a comprehensive MRSA colonization surveillance program. It is not intended to diagnose MRSA infection nor to guide or monitor treatment for MRSA infections. RESULT CALLED TO, READ BACK BY AND VERIFIED WITH: Jonny Ruiz @0826  12/13/15 [facility]     Radiology Reports Dg Chest 2 View  Result Date: 12/12/2015 CLINICAL DATA:  20 year old female with fever cough and left-sided chest pain. History of cystic fibrosis. EXAM: CHEST  2 VIEW COMPARISON:  Chest radiograph dated 11/16/2011 FINDINGS: There are chronic interstitial coarsening and prominence with areas of the cystic changes throughout the lungs compatible with chronic changes of cystic fibrosis. There has been interval increased in the diffuse interstitial coarsening and bronchiectatic changes are with wall thickened walls compared to the prior study. Superimposed pneumonia is not excluded. Clinical correlation is recommended. There is no focal consolidation. No pleural effusion or pneumothorax noted. The cardiac silhouette is within normal limits. No acute osseous pathology identified. IMPRESSION: Chronic changes of cystic fibrosis with interval progression of interstitial scarring and bronchiectatic  changes compared to the prior study. No focal consolidation. Electronically Signed   By: Elgie Collard M.D.   On: 12/12/2015 22:37   Time Spent in minutes  25   Eddie North M.D on 12/13/2015 at 3:18 PM  Between 7am to 7pm - Pager - [phone]  After 7pm go to [email or web address] - password [facility]  [facility] -  Office  [phone]

## 2015-12-13 NOTE — Progress Notes (Signed)
Patient with temp of 102.2. Text paged MD on call. Order received.Will continue to monitor. Ludivina Guymon, Drinda Butts, Charity fundraiser

## 2015-12-13 NOTE — Progress Notes (Signed)
Patient medications (Tobi, Hypertonic and Pulmozyme) were not available at this time. RN aware. RT will check back and RN stated she would call when medications arrived.

## 2015-12-13 NOTE — Plan of Care (Addendum)
Patient oral temp 103.1.  Tylenol not in orders.  Paged Dr. To inform and request order.

## 2015-12-13 NOTE — Plan of Care (Signed)
Lab confirmed - Patient positive for MRSA. Precautions continued and charted. Patient, Family and doctor informed.

## 2015-12-13 NOTE — Progress Notes (Signed)
Initial Nutrition Assessment  DOCUMENTATION CODES:   Underweight  INTERVENTION:  Provide Ensure Enlive po BID, each supplement provides 350 kcal and 20 grams of protein  Encourage adequate PO intake.   NUTRITION DIAGNOSIS:   Increased nutrient needs related to chronic illness as evidenced by estimated needs.  GOAL:   Patient will meet greater than or equal to 90% of their needs  MONITOR:   PO intake, Supplement acceptance, Labs, Weight trends, Skin, I & O's  REASON FOR ASSESSMENT:   Consult Assessment of nutrition requirement/status  ASSESSMENT:   20 y.o. female with medical history significant of cystic fibrosis, pneumothorax, who presents with cough, shortness and chest pain.  Family member request RD to revisit at another time as pt was resting. Unable to obtain nutrition history currently. Unable to complete Nutrition-Focused physical exam at this time. RD to visit. Will order Ensure for now to aid in caloric and protein needs.   Labs and mediations reviewed.   Diet Order:  Diet regular Room service appropriate? Yes; Fluid consistency: Thin  Skin:  Reviewed, no issues  Last BM:  Unknown  Height:   Ht Readings from Last 1 Encounters:  12/13/15 5\' 4"  (1.626 m)    Weight:   Wt Readings from Last 1 Encounters:  12/13/15 98 lb 1.7 oz (44.5 kg)    Ideal Body Weight:  54.5 kg  BMI:  Body mass index is 16.84 kg/m.  Estimated Nutritional Needs:   Kcal:  1700-2000  Protein:  80-95 grams  Fluid:  1.7 - 2 L/day  EDUCATION NEEDS:   No education needs identified at this time  Roslyn Smiling, MS, RD, LDN Pager # [phone] After hours/ weekend pager # [phone]

## 2015-12-14 DIAGNOSIS — J690 Pneumonitis due to inhalation of food and vomit: Secondary | ICD-10-CM

## 2015-12-14 DIAGNOSIS — E43 Unspecified severe protein-calorie malnutrition: Secondary | ICD-10-CM | POA: Diagnosis present

## 2015-12-14 LAB — LEGIONELLA PNEUMOPHILA SEROGP 1 UR AG: L. pneumophila Serogp 1 Ur Ag: NEGATIVE

## 2015-12-14 LAB — BASIC METABOLIC PANEL
ANION GAP: 8 (ref 5–15)
BUN: <5 mg/dL — ABNORMAL LOW (ref 6–20)
CO2: 26 mmol/L (ref 22–32)
Calcium: 8.2 mg/dL — ABNORMAL LOW (ref 8.9–10.3)
Chloride: 100 mmol/L — ABNORMAL LOW (ref 101–111)
Creatinine, Ser: 0.47 mg/dL (ref 0.44–1.00)
GFR calc Af Amer: >60 mL/min (ref >60)
GLUCOSE: 100 mg/dL — AB (ref 65–99)
POTASSIUM: 3.8 mmol/L (ref 3.5–5.1)
Sodium: 134 mmol/L — ABNORMAL LOW (ref 135–145)

## 2015-12-14 LAB — MAGNESIUM: Magnesium: 1.9 mg/dL (ref 1.7–2.4)

## 2015-12-14 LAB — URINE CULTURE

## 2015-12-14 LAB — VANCOMYCIN, TROUGH: VANCOMYCIN TR: 6 ug/mL — AB (ref 15–20)

## 2015-12-14 MED ORDER — MENTHOL 3 MG MT LOZG: 1.0000 | LOZENGE | OROMUCOSAL | Status: DC | PRN

## 2015-12-14 MED ORDER — VANCOMYCIN HCL 10 G IV SOLR
1250.0000 mg | Freq: Three times a day (TID) | INTRAVENOUS | Status: DC
  Administered 2015-12-14 – 2015-12-15 (×5): 1250 mg via INTRAVENOUS
  Filled 2015-12-14 (×6): qty 1250

## 2015-12-14 MED ORDER — ENSURE ENLIVE PO LIQD
237.0000 mL | Freq: Every day | ORAL | Status: DC
  Administered 2015-12-16: 237 mL via ORAL

## 2015-12-14 NOTE — Progress Notes (Signed)
Nutrition Follow-up  DOCUMENTATION CODES:   Severe malnutrition in context of acute illness/injury, Underweight  INTERVENTION:  Provide Ensure Enlive po once daily, each supplement provides 350 kcal and 20 grams of protein.  Provide nourishments snacks between meals. (Ordered)  Encourage adequate PO intake.   NUTRITION DIAGNOSIS:   Increased nutrient needs related to chronic illness as evidenced by estimated needs; ongoing  GOAL:   Patient will meet greater than or equal to 90% of their needs; met  MONITOR:   PO intake, Supplement acceptance, Labs, Weight trends, Skin, I & O's  REASON FOR ASSESSMENT:   Consult Assessment of nutrition requirement/status  ASSESSMENT:   20 y.o. female with medical history significant of cystic fibrosis, pneumothorax, who presents with cough, shortness and chest pain.  Meal completion has been 100%. Pt reports appetite has been improving. Pt reports she has been eating well PTA with usual consumption of at least 3 meals a day with snacks in between. Usual body weight reported to be ~101 lbs which she reports last weighing prior to her wisdom teeth extraction. Pt with a 2.9% weight loss in 1 week. Pt currently has Ensure ordered and reports she is willing to consume one a day. RD to modify orders. Pt is also agreeable to nourishment snacks to aid in caloric needs. Will order.   Nutrition-Focused physical exam completed. Findings are no fat depletion, moderate muscle depletion, and no edema.   Labs and medications reviewed.   Diet Order:  Diet regular Room service appropriate? Yes; Fluid consistency: Thin  Skin:  Reviewed, no issues  Last BM:  7/25  Height:   Ht Readings from Last 1 Encounters:  12/13/15 5' 4"  (1.626 m)    Weight:   Wt Readings from Last 1 Encounters:  12/13/15 98 lb 1.7 oz (44.5 kg)    Ideal Body Weight:  54.5 kg  BMI:  Body mass index is 16.84 kg/m.  Estimated Nutritional Needs:   Kcal:   1700-2000  Protein:  80-95 grams  Fluid:  1.7 - 2 L/day  EDUCATION NEEDS:   No education needs identified at this time  Corrin Parker, MS, RD, LDN Pager # [phone] After hours/ weekend pager # [phone]

## 2015-12-14 NOTE — Progress Notes (Signed)
Pharmacy Antibiotic Note  Anna Harper is a 20 y.o. female admitted on 12/12/2015 with pneumonia.  Pharmacy has been consulted for vancomycin dosing.  Initial trough low.  Plan: Change vanc to 1250mg  IV Q8H and check trough tomorrow.  Height: 5\' 4"  (162.6 cm) Weight: 98 lb 1.7 oz (44.5 kg) IBW/kg (Calculated) : 54.7  Temp (24hrs), Avg:100.4 F (38 C), Min:98.8 F (37.1 C), Max:103.1 F (39.5 C)   Recent Labs Lab 12/12/15 2210 12/12/15 2220 12/13/15 0118 12/13/15 0417 12/14/15 0549  WBC 13.8*  --   --   --   --   CREATININE 0.55  --   --   --  0.47  LATICACIDVEN  --  1.45 1.0 0.7  --   VANCOTROUGH  --   --   --   --  6*    Estimated Creatinine Clearance: 78.1 mL/min (by C-G formula based on SCr of 0.8 mg/dL).    No Known Allergies  Antimicrobials this admission: 7/25 vanc>> 7/25 Zosyn>>  Dose adjustments this admission: Vanc 750mg  Q8H >> trough 6 >> change 1250mg  Q8H  7/26 MRSA positive 7/26 urine>> 7/26 Resp panel>> 7/26 BCx x 2>>  Thank you for allowing pharmacy to be a part of this patient's care.  Vernard Gambles, PharmD, BCPS  12/14/2015 7:16 AM

## 2015-12-14 NOTE — Progress Notes (Signed)
PROGRESS NOTE                                                                                                                                                                                                             Patient Demographics:    Anna Harper, is a 20 y.o. female, DOB - [birth year], WUX:[id number]  Admit date - 12/12/2015   Admitting Physician Lorretta Harp, MD  Outpatient Primary MD for the patient is Maxwell Caul, MD  LOS - 1  Outpatient Specialists: Dr. Camillo Flaming in [facility] cystic fibrosis  Chief Complaint  Patient presents with  . Fever  . Emesis  . Chest Pain       Brief Narrative   20 year old female with history of cystic fibrosis who follows at [facility], history of left pneumothorax and recently had all 4 wisdom tooth removed one week back presented to the ED with productive cough, shortness of breath and chest tightness in the past 3-4 days. She also reported subjective fever with chills. Patient had severe bouts of coughing with 2 episode of vomiting. Denied any headache, abdominal pain, diarrhea, dysuria. Patient was found to have SIRS with sinus tachycardia, WBC of 13.8 and O2 sat of 90% on room air. Remaining labs are stable. Chest excisional chronic cystic fibrosis changes with interval progression with interstitial scarring and bronchitic changes, no focal consolidation. Patient admitted to telemetry.   Subjective:   Was febrile to 10 44F yesterday evening. Patient reports her breathing and cough to be better today. Feels overall better.   Assessment  & Plan :    Principal Problem:   Acute respiratory failure with hypoxia (HCC) Possibly cystic fibrosis flareup versus aspiration pneumonia from recent wisdom teeth removal. Patient reports coughing and may have aspirated during the procedure. O2 sat stable on 2 L. Continue Empiric IV vancomycin and Zosyn. Added azithromycin for atypical  coverage. Blood and sputum cultures pending. Urine for strep antigen negative, Legionella antigen pending. Continue Mucinex. Tylenol when necessary for fever. Resume her home tobramycin nebulizer. Added Xopenex nebs. Resume home medications.   Active Problems:   Hx of cystic fibrosis Resume hypertonic normal saline nebulizer solution twice daily. Resumed Creon, sodium chloride inhalant 7% bid. Resume Pulmozyme.  Sinus tachycardia Both patient and her mother report she has significant tachycardia during hospital visit that potentially is triggered with  anxiety. Heart rate ranging from 110-140. Continue IV hydration.  Oral thrush Added fluconazole     Microcytic anemia Stable.       Code Status : Full code  Family Communication  : Spoke with mother  Disposition Plan  : Home tomorrow if no further fever and cultures negative.  Barriers For Discharge : Active symptoms  Consults  :  None  Procedures  : None  DVT Prophylaxis  :  Lovenox -   Lab Results  Component Value Date   PLT PLATELETS APPEAR ADEQUATE 12/12/2015    Antibiotics  :   Anti-infectives    Start     Dose/Rate Route Frequency Ordered Stop   12/14/15 1200  vancomycin (VANCOCIN) 1,250 mg in sodium chloride 0.9 % 250 mL IVPB     1,250 mg 166.7 mL/hr over 90 Minutes Intravenous Every 8 hours 12/14/15 0713     12/13/15 1015  fluconazole (DIFLUCAN) tablet 200 mg     200 mg Oral Daily 12/13/15 1005     12/13/15 1000  azithromycin (ZITHROMAX) tablet 500 mg    Comments:  Monday, Wednesday and Friday     500 mg Oral Once per day on Mon Wed Fri 12/13/15 0057     12/13/15 0600  vancomycin (VANCOCIN) IVPB 750 mg/150 ml premix  Status:  Discontinued     750 mg 150 mL/hr over 60 Minutes Intravenous Every 8 hours 12/13/15 0321 12/14/15 0712   12/13/15 0600  piperacillin-tazobactam (ZOSYN) IVPB 3.375 g     3.375 g 12.5 mL/hr over 240 Minutes Intravenous Every 8 hours 12/13/15 0321     12/13/15 0300  tobramycin  (PF) (TOBI) nebulizer solution 300 mg    Comments:  Take for 30 days then hold for 30.     300 mg Nebulization 2 times daily 12/13/15 0057     12/12/15 2330  ceFEPIme (MAXIPIME) 2 g in dextrose 5 % 50 mL IVPB     2 g 100 mL/hr over 30 Minutes Intravenous  Once 12/12/15 2326 12/13/15 0100   12/12/15 2330  vancomycin (VANCOCIN) IVPB 1000 mg/200 mL premix     1,000 mg 200 mL/hr over 60 Minutes Intravenous  Once 12/12/15 2326 12/13/15 0200        Objective:   Vitals:   12/14/15 0755 12/14/15 0839 12/14/15 0916 12/14/15 1239  BP:   (!) 98/54   Pulse:   (!) 115   Resp:   18   Temp:   98.4 F (36.9 C)   TempSrc:   Oral   SpO2: 100% 98% 99% 98%  Weight:      Height:        Wt Readings from Last 3 Encounters:  12/13/15 44.5 kg (98 lb 1.7 oz)  03/15/14 41.6 kg (91 lb 12 oz) (<1 %, Z < -2.33)*  11/16/11 38.4 kg (84 lb 11.2 oz) (<1 %, Z < -2.33)*   * Growth percentiles are based on CDC 2-20 Years data.     Intake/Output Summary (Last 24 hours) at 12/14/15 1409 Last data filed at 12/14/15 0932  Gross per 24 hour  Intake             3390 ml  Output             3500 ml  Net             -110 ml     Physical Exam  Gen: not in distress HEENT: , moist mucosa, Oral thrush, supple neck Chest:  Coarse breath sounds bilaterally CVS:  S1&S2 tachycardic, no murmurs, rubs or gallop GI: soft, NT, ND,  Musculoskeletal: warm, no edema     Data Review:    CBC  Recent Labs Lab 12/12/15 2210  WBC 13.8*  HGB 12.0  HCT 39.8  PLT PLATELETS APPEAR ADEQUATE  MCV 69.2*  MCH 20.9*  MCHC 30.2  RDW 16.8*  LYMPHSABS 1.9  MONOABS 1.4*  EOSABS 0.0  BASOSABS 0.0    Chemistries   Recent Labs Lab 12/12/15 2210 12/14/15 0549  NA 132* 134*  K 4.0 3.8  CL 96* 100*  CO2 25 26  GLUCOSE 150* 100*  BUN <5* <5*  CREATININE 0.55 0.47  CALCIUM 9.3 8.2*  MG  --  1.9  AST 19  --   ALT 13*  --   ALKPHOS 83  --   BILITOT 0.4  --     ------------------------------------------------------------------------------------------------------------------ No results for input(s): CHOL, HDL, LDLCALC, TRIG, CHOLHDL, LDLDIRECT in the last 72 hours.  No results found for: HGBA1C ------------------------------------------------------------------------------------------------------------------ No results for input(s): TSH, T4TOTAL, T3FREE, THYROIDAB in the last 72 hours.  Invalid input(s): FREET3 ------------------------------------------------------------------------------------------------------------------ No results for input(s): VITAMINB12, FOLATE, FERRITIN, TIBC, IRON, RETICCTPCT in the last 72 hours.  Coagulation profile  Recent Labs Lab 12/13/15 0118  INR 1.38    No results for input(s): DDIMER in the last 72 hours.  Cardiac Enzymes No results for input(s): CKMB, TROPONINI, MYOGLOBIN in the last 168 hours.  Invalid input(s): CK ------------------------------------------------------------------------------------------------------------------ No results found for: BNP  Inpatient Medications  Scheduled Meds: . azithromycin  500 mg Oral Once per day on Mon Wed Fri  . Chlorhexidine Gluconate Cloth  6 each Topical Q0600  . dextromethorphan-guaiFENesin  1 tablet Oral BID  . dornase alpha  2.5 mg Nebulization Daily  . enoxaparin (LOVENOX) injection  30 mg Subcutaneous Daily  . feeding supplement (ENSURE ENLIVE)  237 mL Oral BID BM  . fluconazole  200 mg Oral Daily  . levalbuterol  1.25 mg Nebulization Q6H  . lipase/protease/amylase (CREON) 120,000 Units  120,000 Units Oral TID WC  . mupirocin ointment  1 application Nasal BID  . pantoprazole  40 mg Oral Daily  . piperacillin-tazobactam (ZOSYN)  IV  3.375 g Intravenous Q8H  . Sodium Chloride (Inhalant)  1 ampule Inhalation BID  . sodium chloride HYPERTONIC  4 mL Nebulization BID  . tobramycin (PF)  300 mg Nebulization BID  . vancomycin  1,250 mg Intravenous Q8H    Continuous Infusions: . sodium chloride 100 mL/hr at 12/14/15 0422   PRN Meds:.acetaminophen, lipase/protease/amylase, ondansetron  Micro Results Recent Results (from the past 240 hour(s))  Culture, respiratory (NON-Expectorated)     Status: None (Preliminary result)   Collection Time: 12/13/15 12:21 AM  Result Value Ref Range Status   Specimen Description TRACHEAL ASPIRATE  Final   Special Requests NONE  Final   Gram Stain   Final    FEW WBC PRESENT, PREDOMINANTLY PMN ABUNDANT GRAM POSITIVE COCCI IN CLUSTERS IN PAIRS MODERATE GRAM NEGATIVE COCCOBACILLI    Culture PENDING  Incomplete   Report Status PENDING  Incomplete  Urine culture     Status: Abnormal   Collection Time: 12/13/15  1:01 AM  Result Value Ref Range Status   Specimen Description URINE, RANDOM  Final   Special Requests NONE  Final   Culture MULTIPLE SPECIES PRESENT, SUGGEST RECOLLECTION (A)  Final   Report Status 12/14/2015 FINAL  Final  MRSA PCR Screening     Status: Abnormal  Collection Time: 12/13/15  2:13 AM  Result Value Ref Range Status   MRSA by PCR POSITIVE (A) NEGATIVE Final    Comment:        The GeneXpert MRSA Assay (FDA approved for NASAL specimens only), is one component of a comprehensive MRSA colonization surveillance program. It is not intended to diagnose MRSA infection nor to guide or monitor treatment for MRSA infections. RESULT CALLED TO, READ BACK BY AND VERIFIED WITH: M WRIGHT,RN @0826  12/13/15 MKELLY   Respiratory Panel by PCR     Status: None   Collection Time: 12/13/15  4:42 AM  Result Value Ref Range Status   Adenovirus NOT DETECTED NOT DETECTED Final   Coronavirus 229E NOT DETECTED NOT DETECTED Final   Coronavirus HKU1 NOT DETECTED NOT DETECTED Final   Coronavirus NL63 NOT DETECTED NOT DETECTED Final   Coronavirus OC43 NOT DETECTED NOT DETECTED Final   Metapneumovirus NOT DETECTED NOT DETECTED Final   Rhinovirus / Enterovirus NOT DETECTED NOT DETECTED Final   Influenza  A NOT DETECTED NOT DETECTED Final   Influenza A H1 NOT DETECTED NOT DETECTED Final   Influenza A H1 2009 NOT DETECTED NOT DETECTED Final   Influenza A H3 NOT DETECTED NOT DETECTED Final   Influenza B NOT DETECTED NOT DETECTED Final   Parainfluenza Virus 1 NOT DETECTED NOT DETECTED Final   Parainfluenza Virus 2 NOT DETECTED NOT DETECTED Final   Parainfluenza Virus 3 NOT DETECTED NOT DETECTED Final   Parainfluenza Virus 4 NOT DETECTED NOT DETECTED Final   Respiratory Syncytial Virus NOT DETECTED NOT DETECTED Final   Bordetella pertussis NOT DETECTED NOT DETECTED Final   Chlamydophila pneumoniae NOT DETECTED NOT DETECTED Final   Mycoplasma pneumoniae NOT DETECTED NOT DETECTED Final    Radiology Reports Dg Chest 2 View  Result Date: 12/12/2015 CLINICAL DATA:  20 year old female with fever cough and left-sided chest pain. History of cystic fibrosis. EXAM: CHEST  2 VIEW COMPARISON:  Chest radiograph dated 11/16/2011 FINDINGS: There are chronic interstitial coarsening and prominence with areas of the cystic changes throughout the lungs compatible with chronic changes of cystic fibrosis. There has been interval increased in the diffuse interstitial coarsening and bronchiectatic changes are with wall thickened walls compared to the prior study. Superimposed pneumonia is not excluded. Clinical correlation is recommended. There is no focal consolidation. No pleural effusion or pneumothorax noted. The cardiac silhouette is within normal limits. No acute osseous pathology identified. IMPRESSION: Chronic changes of cystic fibrosis with interval progression of interstitial scarring and bronchiectatic changes compared to the prior study. No focal consolidation. Electronically Signed   By: Elgie Collard M.D.   On: 12/12/2015 22:37   Time Spent in minutes  25   Eddie North M.D on 12/14/2015 at 2:09 PM  Between 7am to 7pm - Pager - [phone]  After 7pm go to [email or web address] - password [facility]  [facility] -  Office  [phone]

## 2015-12-15 DIAGNOSIS — E43 Unspecified severe protein-calorie malnutrition: Secondary | ICD-10-CM

## 2015-12-15 DIAGNOSIS — J189 Pneumonia, unspecified organism: Secondary | ICD-10-CM

## 2015-12-15 DIAGNOSIS — A419 Sepsis, unspecified organism: Secondary | ICD-10-CM

## 2015-12-15 LAB — CBC
HEMATOCRIT: 31.2 % — AB (ref 36.0–46.0)
Hemoglobin: 9.4 g/dL — ABNORMAL LOW (ref 12.0–15.0)
MCH: 21.1 pg — AB (ref 26.0–34.0)
MCHC: 30.1 g/dL (ref 30.0–36.0)
MCV: 70.1 fL — AB (ref 78.0–100.0)
PLATELETS: 465 10*3/uL — AB (ref 150–400)
RBC: 4.45 MIL/uL (ref 3.87–5.11)
RDW: 17.2 % — AB (ref 11.5–15.5)
WBC: 12.7 10*3/uL — AB (ref 4.0–10.5)

## 2015-12-15 LAB — CULTURE, RESPIRATORY W GRAM STAIN

## 2015-12-15 LAB — CULTURE, RESPIRATORY

## 2015-12-15 LAB — VANCOMYCIN, TROUGH: Vancomycin Tr: 15 ug/mL (ref 15–20)

## 2015-12-15 MED ORDER — TOBRAMYCIN SULFATE 80 MG/2ML IJ SOLN
70.0000 mg | Freq: Three times a day (TID) | INTRAVENOUS | Status: DC
  Administered 2015-12-16: 70 mg via INTRAVENOUS
  Filled 2015-12-15 (×4): qty 1.75

## 2015-12-15 MED ORDER — TOBRAMYCIN SULFATE 80 MG/2ML IJ SOLN
2.0000 mg/kg | Freq: Once | INTRAVENOUS | Status: AC
  Administered 2015-12-15: 90 mg via INTRAVENOUS
  Filled 2015-12-15: qty 2.25

## 2015-12-15 MED ORDER — DEXTROSE 5 % IV SOLN
2.0000 g | Freq: Three times a day (TID) | INTRAVENOUS | Status: DC
  Administered 2015-12-15 – 2015-12-18 (×8): 2 g via INTRAVENOUS
  Filled 2015-12-15 (×12): qty 2

## 2015-12-15 MED ORDER — DOXYCYCLINE HYCLATE 100 MG PO TABS
100.0000 mg | ORAL_TABLET | Freq: Two times a day (BID) | ORAL | Status: DC
  Administered 2015-12-15 – 2015-12-18 (×6): 100 mg via ORAL
  Filled 2015-12-15 (×7): qty 1

## 2015-12-15 MED ORDER — TOBRAMYCIN SULFATE 80 MG/2ML IJ SOLN: 7.0000 mg/kg | INTRAVENOUS | Status: DC

## 2015-12-15 NOTE — Progress Notes (Signed)
Pharmacy Antibiotic Note Anna Harper is a 20 y.o. female admitted on 12/12/2015 with SOB and concern for PNA in setting of cystic fibrosis. Currently on day 3 of Zosyn and vancomycin for treatment .   VT drawn early in course of therapy following a recent dose change resulted as 15. This is reflective of a 6 - 7 hour trough; however, expect continued accumulation across dosing interval.   Plan: 1. Continue vancomycin 1250 mg IV every 8 hours for now; if remains inpatient will obtain another trough first of next week. 2. Zosyn 3.375g IV q8h (4 hour infusion).  3. SCr Q72H while on vancomycin.  Height: 5\' 4"  (162.6 cm) Weight: 102 lb 14.4 oz (46.7 kg) IBW/kg (Calculated) : 54.7  Temp (24hrs), Avg:99.3 F (37.4 C), Min:98.2 F (36.8 C), Max:102 F (38.9 C)   Recent Labs Lab 12/12/15 2210 12/12/15 2220 12/13/15 0118 12/13/15 0417 12/14/15 0549 12/15/15 0615 12/15/15 1053  WBC 13.8*  --   --   --   --  12.7*  --   CREATININE 0.55  --   --   --  0.47  --   --   LATICACIDVEN  --  1.45 1.0 0.7  --   --   --   VANCOTROUGH  --   --   --   --  6*  --  15    Estimated Creatinine Clearance: 82 mL/min (by C-G formula based on SCr of 0.8 mg/dL).    No Known Allergies  Antimicrobials this admission: 7/26 Zosyn >>  7/26 Vancomycin >>   Dose adjustments this admission: 7/27 Vancomycin adjusted to 1250 mg Q8H following trough of 5 on 750 mg q8h  Microbiology results: 7/26 BCx: ngtd 7/26 UCx: recollect  7/26 Sputum: multiple organisms present   7/26 MRSA PCR: positive  Thank you for allowing pharmacy to be a part of this patient's care.   Pollyann Samples, PharmD, BCPS 12/15/2015, 1:23 PM Pager: [phone]

## 2015-12-15 NOTE — Progress Notes (Signed)
PROGRESS NOTE                                                                                                                                                                                                             Patient Demographics:    Anna Harper, is a 20 y.o. female, DOB - 1995/10/25, ZOX:[id number]  Admit date - 12/12/2015   Admitting Physician Lorretta Harp, MD  Outpatient Primary MD for the patient is Maxwell Caul, MD  LOS - 2  Outpatient Specialists: Dr. Camillo Flaming in [facility] cystic fibrosis  Chief Complaint  Patient presents with  . Fever  . Emesis  . Chest Pain       Brief Narrative   20 year old female with history of cystic fibrosis who follows at [facility], history of left pneumothorax and recently had all 4 wisdom tooth removed one week back presented to the ED with productive cough, shortness of breath and chest tightness in the past 3-4 days. She also reported subjective fever with chills. Patient had severe bouts of coughing with 2 episode of vomiting. Denied any headache, abdominal pain, diarrhea, dysuria. Patient was found to have SIRS with sinus tachycardia, WBC of 13.8 and O2 sat of 90% on room air. Remaining labs are stable. Chest excisional chronic cystic fibrosis changes with interval progression with interstitial scarring and bronchitic changes, no focal consolidation. Patient admitted to telemetry.   Subjective:   Tmax of 102F yesterday.has hoarseness ov voice which she attributes to to tobramycin.   Assessment  & Plan :    Principal Problem:   Acute respiratory failure with hypoxia (HCC) Possibly cystic fibrosis flareup versus aspiration pneumonia from recent wisdom teeth removal. Patient reports coughing and may have aspirated during the procedure. -Maintaining sats on room air. Empiric IV vancomycin and Zosyn. Added azithromycin for atypical coverage. Cultures so far negative..  urine for strep antigen and Legionella negative as well. Added Mucinex. Tylenol when necessary for fever. Continue her home tobramycin nebulizer. Added Xopenex nebs.   Active Problems:   Hx of cystic fibrosis Continue hypertonic normal saline nebulizer solution twice daily. Resumed Creon, sodium chloride inhalant 7% bid and Pulmozyme.  Sinus tachycardia Both patient and her mother report she has significant tachycardia during hospital visit that potentially is triggered with anxiety. Heart is stable in the 130s. Continue IV hydration for now.  Oral thrush Improved with fluconazole     Microcytic anemia Stable.  Anemia Drop in h&H from admission. Low MCV. Will monitor for now.     Code Status : Full code  Family Communication  : Spoke with mother at bedside  Disposition Plan  : Home if improving and afebrile for 48 hrs  Barriers For Discharge : Active symptoms  Consults  :  None  Procedures  : None  DVT Prophylaxis  :  Lovenox -   Lab Results  Component Value Date   PLT 465 (H) 12/15/2015    Antibiotics  :   Anti-infectives    Start     Dose/Rate Route Frequency Ordered Stop   12/14/15 1200  vancomycin (VANCOCIN) 1,250 mg in sodium chloride 0.9 % 250 mL IVPB     1,250 mg 166.7 mL/hr over 90 Minutes Intravenous Every 8 hours 12/14/15 0713     12/13/15 1015  fluconazole (DIFLUCAN) tablet 200 mg     200 mg Oral Daily 12/13/15 1005     12/13/15 1000  azithromycin (ZITHROMAX) tablet 500 mg    Comments:  Monday, Wednesday and Friday     500 mg Oral Once per day on Mon Wed Fri 12/13/15 0057     12/13/15 0600  vancomycin (VANCOCIN) IVPB 750 mg/150 ml premix  Status:  Discontinued     750 mg 150 mL/hr over 60 Minutes Intravenous Every 8 hours 12/13/15 0321 12/14/15 0712   12/13/15 0600  piperacillin-tazobactam (ZOSYN) IVPB 3.375 g     3.375 g 12.5 mL/hr over 240 Minutes Intravenous Every 8 hours 12/13/15 0321     12/13/15 0300  tobramycin (PF) (TOBI) nebulizer  solution 300 mg    Comments:  Take for 30 days then hold for 30.     300 mg Nebulization 2 times daily 12/13/15 0057     12/12/15 2330  ceFEPIme (MAXIPIME) 2 g in dextrose 5 % 50 mL IVPB     2 g 100 mL/hr over 30 Minutes Intravenous  Once 12/12/15 2326 12/13/15 0100   12/12/15 2330  vancomycin (VANCOCIN) IVPB 1000 mg/200 mL premix     1,000 mg 200 mL/hr over 60 Minutes Intravenous  Once 12/12/15 2326 12/13/15 0200        Objective:   Vitals:   12/15/15 0133 12/15/15 0509 12/15/15 0811 12/15/15 0914  BP:  (!) 98/50  113/60  Pulse:  (!) 110  (!) 130  Resp:  19  19  Temp:  98.6 F (37 C)  98.4 F (36.9 C)  TempSrc:  Oral  Oral  SpO2: 97% 98% 96% 98%  Weight:      Height:        Wt Readings from Last 3 Encounters:  12/14/15 46.7 kg (102 lb 14.4 oz)  03/15/14 41.6 kg (91 lb 12 oz) (<1 %, Z < -2.33)*  11/16/11 38.4 kg (84 lb 11.2 oz) (<1 %, Z < -2.33)*   * Growth percentiles are based on CDC 2-20 Years data.     Intake/Output Summary (Last 24 hours) at 12/15/15 1333 Last data filed at 12/15/15 0930  Gross per 24 hour  Intake             3750 ml  Output             1000 ml  Net             2750 ml     Physical Exam  Gen: not in distress HEENT: no pallor,  moist mucosa, Oral thrush improved, supple neck Chest: Coarse crackles at left lung base. CVS:  S1&S2 tachycardic, no murmurs, rubs or gallop GI: soft, NT, ND, BS+ Musculoskeletal: warm, no edema     Data Review:    CBC  Recent Labs Lab 12/12/15 2210 12/15/15 0615  WBC 13.8* 12.7*  HGB 12.0 9.4*  HCT 39.8 31.2*  PLT PLATELETS APPEAR ADEQUATE 465*  MCV 69.2* 70.1*  MCH 20.9* 21.1*  MCHC 30.2 30.1  RDW 16.8* 17.2*  LYMPHSABS 1.9  --   MONOABS 1.4*  --   EOSABS 0.0  --   BASOSABS 0.0  --     Chemistries   Recent Labs Lab 12/12/15 2210 12/14/15 0549  NA 132* 134*  K 4.0 3.8  CL 96* 100*  CO2 25 26  GLUCOSE 150* 100*  BUN <5* <5*  CREATININE 0.55 0.47  CALCIUM 9.3 8.2*  MG  --  1.9    AST 19  --   ALT 13*  --   ALKPHOS 83  --   BILITOT 0.4  --    ------------------------------------------------------------------------------------------------------------------ No results for input(s): CHOL, HDL, LDLCALC, TRIG, CHOLHDL, LDLDIRECT in the last 72 hours.  No results found for: HGBA1C ------------------------------------------------------------------------------------------------------------------ No results for input(s): TSH, T4TOTAL, T3FREE, THYROIDAB in the last 72 hours.  Invalid input(s): FREET3 ------------------------------------------------------------------------------------------------------------------ No results for input(s): VITAMINB12, FOLATE, FERRITIN, TIBC, IRON, RETICCTPCT in the last 72 hours.  Coagulation profile  Recent Labs Lab 12/13/15 0118  INR 1.38    No results for input(s): DDIMER in the last 72 hours.  Cardiac Enzymes No results for input(s): CKMB, TROPONINI, MYOGLOBIN in the last 168 hours.  Invalid input(s): CK ------------------------------------------------------------------------------------------------------------------ No results found for: BNP  Inpatient Medications  Scheduled Meds: . azithromycin  500 mg Oral Once per day on Mon Wed Fri  . Chlorhexidine Gluconate Cloth  6 each Topical Q0600  . dextromethorphan-guaiFENesin  1 tablet Oral BID  . dornase alpha  2.5 mg Nebulization Daily  . enoxaparin (LOVENOX) injection  30 mg Subcutaneous Daily  . feeding supplement (ENSURE ENLIVE)  237 mL Oral Q1500  . fluconazole  200 mg Oral Daily  . levalbuterol  1.25 mg Nebulization Q6H  . lipase/protease/amylase (CREON) 120,000 Units  120,000 Units Oral TID WC  . mupirocin ointment  1 application Nasal BID  . pantoprazole  40 mg Oral Daily  . piperacillin-tazobactam (ZOSYN)  IV  3.375 g Intravenous Q8H  . Sodium Chloride (Inhalant)  1 ampule Inhalation BID  . sodium chloride HYPERTONIC  4 mL Nebulization BID  . tobramycin (PF)   300 mg Nebulization BID  . vancomycin  1,250 mg Intravenous Q8H   Continuous Infusions: . sodium chloride 100 mL/hr at 12/15/15 0522   PRN Meds:.acetaminophen, lipase/protease/amylase, menthol-cetylpyridinium, ondansetron  Micro Results Recent Results (from the past 240 hour(s))  Blood Culture (routine x 2)     Status: None (Preliminary result)   Collection Time: 12/13/15 12:00 AM  Result Value Ref Range Status   Specimen Description BLOOD LEFT ARM  Final   Special Requests BOTTLES DRAWN AEROBIC AND ANAEROBIC  Final   Culture NO GROWTH 1 DAY  Final   Report Status PENDING  Incomplete  Blood Culture (routine x 2)     Status: None (Preliminary result)   Collection Time: 12/13/15 12:08 AM  Result Value Ref Range Status   Specimen Description BLOOD LEFT ARM  Final   Special Requests IN PEDIATRIC BOTTLE  Final   Culture NO GROWTH 1 DAY  Final   Report Status PENDING  Incomplete  Culture, respiratory (NON-Expectorated)     Status: None   Collection Time: 12/13/15 12:21 AM  Result Value Ref Range Status   Specimen Description TRACHEAL ASPIRATE  Final   Special Requests NONE  Final   Gram Stain   Final    FEW WBC PRESENT, PREDOMINANTLY PMN ABUNDANT GRAM POSITIVE COCCI IN CLUSTERS IN PAIRS MODERATE GRAM NEGATIVE COCCOBACILLI    Culture MULTIPLE ORGANISMS PRESENT, NONE PREDOMINANT  Final   Report Status 12/15/2015 FINAL  Final  Urine culture     Status: Abnormal   Collection Time: 12/13/15  1:01 AM  Result Value Ref Range Status   Specimen Description URINE, RANDOM  Final   Special Requests NONE  Final   Culture MULTIPLE SPECIES PRESENT, SUGGEST RECOLLECTION (A)  Final   Report Status 12/14/2015 FINAL  Final  MRSA PCR Screening     Status: Abnormal   Collection Time: 12/13/15  2:13 AM  Result Value Ref Range Status   MRSA by PCR POSITIVE (A) NEGATIVE Final    Comment:        The GeneXpert MRSA Assay (FDA approved for NASAL specimens only), is one component of  a comprehensive MRSA colonization surveillance program. It is not intended to diagnose MRSA infection nor to guide or monitor treatment for MRSA infections. RESULT CALLED TO, READ BACK BY AND VERIFIED WITH: M WRIGHT,RN  12/13/15 MKELLY   Respiratory Panel by PCR     Status: None   Collection Time: 12/13/15  4:42 AM  Result Value Ref Range Status   Adenovirus NOT DETECTED NOT DETECTED Final   Coronavirus 229E NOT DETECTED NOT DETECTED Final   Coronavirus HKU1 NOT DETECTED NOT DETECTED Final   Coronavirus NL63 NOT DETECTED NOT DETECTED Final   Coronavirus OC43 NOT DETECTED NOT DETECTED Final   Metapneumovirus NOT DETECTED NOT DETECTED Final   Rhinovirus / Enterovirus NOT DETECTED NOT DETECTED Final   Influenza A NOT DETECTED NOT DETECTED Final   Influenza A H1 NOT DETECTED NOT DETECTED Final   Influenza A H1 2009 NOT DETECTED NOT DETECTED Final   Influenza A H3 NOT DETECTED NOT DETECTED Final   Influenza B NOT DETECTED NOT DETECTED Final   Parainfluenza Virus 1 NOT DETECTED NOT DETECTED Final   Parainfluenza Virus 2 NOT DETECTED NOT DETECTED Final   Parainfluenza Virus 3 NOT DETECTED NOT DETECTED Final   Parainfluenza Virus 4 NOT DETECTED NOT DETECTED Final   Respiratory Syncytial Virus NOT DETECTED NOT DETECTED Final   Bordetella pertussis NOT DETECTED NOT DETECTED Final   Chlamydophila pneumoniae NOT DETECTED NOT DETECTED Final   Mycoplasma pneumoniae NOT DETECTED NOT DETECTED Final    Radiology Reports Dg Chest 2 View  Result Date: 12/12/2015 CLINICAL DATA:  20 year old female with fever cough and left-sided chest pain. History of cystic fibrosis. EXAM: CHEST  2 VIEW COMPARISON:  Chest radiograph dated 11/16/2011 FINDINGS: There are chronic interstitial coarsening and prominence with areas of the cystic changes throughout the lungs compatible with chronic changes of cystic fibrosis. There has been interval increased in the diffuse interstitial coarsening and bronchiectatic  changes are with wall thickened walls compared to the prior study. Superimposed pneumonia is not excluded. Clinical correlation is recommended. There is no focal consolidation. No pleural effusion or pneumothorax noted. The cardiac silhouette is within normal limits. No acute osseous pathology identified. IMPRESSION: Chronic changes of cystic fibrosis with interval progression of interstitial scarring and bronchiectatic changes compared to the prior study. No  focal consolidation. Electronically Signed   By: Elgie Collard M.D.   On: 12/12/2015 22:37   Time Spent in minutes  25   Eddie North M.D on 12/15/2015 at 1:33 PM  Between 7am to 7pm - Pager - [phone]  After 7pm go to [email or web address] - password [facility]  [facility] -  Office  [phone]

## 2015-12-15 NOTE — Consult Note (Signed)
Pulmonary / Cystic Fibrosis Consult Note  Patient name: Anna Harper Medical record number: [id number] Date of birth: [birth year] Age: 20 y.o. 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  The patient underwent routine 3rd molar extraction 10 days ago and during the procedure seemed to have had some sort of aspiration event. About 2-3 days later, she developed worsening cough, fevers (not an uncommon symptom for her) and pleuritic pain over L posterolateral chest wall, and dyspnea. She presented to [facility] and was admitted. She was treated empirically for pneumonia, although her imaging did not show a focal area of airspace disease beyond typical CF changes. Cultures were negative, save for a sputum cx which grew multiple species (GPCs and GNRs) and was not further cultured.  Past Medical History:  Diagnosis Date  . Acute respiratory failure (HCC) 02/24/14  . Cystic fibrosis with pulmonary manifestations (HCC)   . Hx of cystic fibrosis 02/24/14  . Hyponatremia 02/24/14  . Microcytic anemia 02/24/14  . MRSA bacteremia 02/24/14  . Oral thrush 02/24/14  . Pneumonia 02/24/14   MULTIFOCAL  . Pneumothorax, left   . Sepsis due to pneumonia (HCC) 02/24/14  . Severe protein-calorie malnutrition (HCC) 02/24/14  . Uveitis     Past Surgical History:  Procedure Laterality Date  . CHEST TUBE INSERTION Left   . WISDOM TOOTH EXTRACTION      Family History  Problem Relation Age of Onset  . Autoimmune disease Mother   . Valvular heart disease Father   . Cystic fibrosis Sister    . Cystic fibrosis Brother     Social History:  reports that she has never smoked. She does not have any smokeless tobacco history on file. She reports that she does not drink alcohol or use drugs.  Allergies: No Known Allergies  Medications: I have reviewed the patient's current medications.  Pertinent items are noted in HPI.  Temp:  [98.2 F (36.8 C)-99.5 F (37.5 C)] 99.5 F (37.5 C) (07/28 1616) Pulse Rate:  [105-130] 128 (07/28 1616) Resp:  [18-20] 18 (07/28 1616) BP: (98-113)/(50-64) 104/64 (07/28 1616) SpO2:  [92 %-100 %] 95 % (07/28 1616) Weight:  [102 lb 14.4 oz (46.7 kg)] 102 lb 14.4 oz (46.7 kg) (07/27 2156)  Intake/Output Summary (Last 24 hours) at 12/15/15 2008 Last data filed at 12/15/15 1853  Gross per 24 hour  Intake             4570 ml  Output             1200 ml  Net             3370 ml   Physical exam  Physical Exam  Constitutional: She is oriented to person, place, and time.  thin  HENT:  Head: Normocephalic.  Eyes: EOM are normal.  Neck: Normal range of motion.  Cardiovascular: Regular rhythm and normal heart sounds.   No murmur heard. tachycardic  Pulmonary/Chest: No stridor. She has no wheezes. She exhibits no tenderness.  Crackles heard diffusely  Abdominal: Soft. Bowel sounds are normal. She  exhibits no distension.  Musculoskeletal: Normal range of motion. She exhibits no edema.  Clubbing present  Neurological: She is alert and oriented to person, place, and time.  Skin: Skin is warm and dry.  Psychiatric: She has a normal mood and affect.      LAB RESULTS BMET    Component Value Date/Time   NA 134 (L) 12/14/2015 0549   K 3.8 12/14/2015 0549   CL 100 (L) 12/14/2015 0549   CO2 26 12/14/2015 0549   GLUCOSE 100 (H) 12/14/2015 0549   BUN <5 (L) 12/14/2015 0549   CREATININE 0.47 12/14/2015 0549   CALCIUM 8.2 (L) 12/14/2015 0549   GFRNONAA >60 12/14/2015 0549   GFRAA >60 12/14/2015 0549   CBC    Component Value Date/Time   WBC  12.7 (H) 12/15/2015 0615   RBC 4.45 12/15/2015 0615   HGB 9.4 (L) 12/15/2015 0615   HCT 31.2 (L) 12/15/2015 0615   PLT 465 (H) 12/15/2015 0615   MCV 70.1 (L) 12/15/2015 0615   MCH 21.1 (L) 12/15/2015 0615   MCHC 30.1 12/15/2015 0615   RDW 17.2 (H) 12/15/2015 0615   LYMPHSABS 1.9 12/12/2015 2210   MONOABS 1.4 (H) 12/12/2015 2210   EOSABS 0.0 12/12/2015 2210   BASOSABS 0.0 12/12/2015 2210   ABG No results found for: PHART, HCO3, TCO2, ACIDBASEDEF, O2SAT Radiology I reviewed her CXR which shows diffuse focal opacities and airway thickening consistent with typical cystic fibrosis associated lung disease  Assessment and plan:  CF with acute exacerbation: Reviewed prior culture history, typically grows MRSA and Pseudomonas, with good susceptibilities. - Change antibiotics to ceftaz/tobra + doxy for MRSA. - PICC ordered - spirometry ordered Will plan to d/c home hopefully on Monday to complete two week course of ceftaz / tobra + doxy. Will f/u with Anna Harper @ [facility] (I will arrange). Cont. Inh tobra / HS / pulmozyme. Cont. Enzymes.  Discussed recently announced nextgen modulators. Pt will need to increase adherence.  Anna Kato, MD Pulmonary & Critical Care Medicine December 15, 2015, 8:22 PM

## 2015-12-15 NOTE — Progress Notes (Addendum)
Pharmacy Antibiotic Note Anna Harper is a 20 y.o. female admitted on 12/12/2015 with SOB and concern for PNA in setting of cystic fibrosis. Currently on day 3 of Zosyn and vancomycin for treatment .   Pharmacy is consulted to dose tobramycin. Vancomycin and zosyn were stopped and ceftazidime and doxycycline started.  Plan: Ceftazidime 2g IV q8h Doxycycline 100mg  PO q12h Tobramycin 90mg  loading dose followed by 70mg  q8h Tobramycin peak at 2200 (goal 8-10 mg/L) Tobramycin trough at 2200 7/29 (goal < 2 mg/L)  Height: 5\' 4"  (162.6 cm) Weight: 102 lb 14.4 oz (46.7 kg) IBW/kg (Calculated) : 54.7  Temp (24hrs), Avg:98.8 F (37.1 C), Min:98.2 F (36.8 C), Max:99.5 F (37.5 C)   Recent Labs Lab 12/12/15 2210 12/12/15 2220 12/13/15 0118 12/13/15 0417 12/14/15 0549 12/15/15 0615 12/15/15 1053  WBC 13.8*  --   --   --   --  12.7*  --   CREATININE 0.55  --   --   --  0.47  --   --   LATICACIDVEN  --  1.45 1.0 0.7  --   --   --   VANCOTROUGH  --   --   --   --  6*  --  15    Estimated Creatinine Clearance: 82 mL/min (by C-G formula based on SCr of 0.8 mg/dL).    No Known Allergies  Antimicrobials this admission: 7/26 Zosyn >> 7/28 7/26 Vancomycin >> 7/28 7/28 Ceftaz >> 7/28 Doxy >> 7/28 Tobra >>   Dose adjustments this admission: 7/27 Vancomycin adjusted to 1250 mg Q8H following trough of 5 on 750 mg q8h  Microbiology results: 7/26 BCx: ngtd 7/26 UCx: recollect  7/26 Sputum: multiple organisms present   7/26 MRSA PCR: positive    [name]. Newman Pies, PharmD, BCPS Clinical Pharmacist 12/15/2015, 8:39 PM

## 2015-12-16 DIAGNOSIS — A419 Sepsis, unspecified organism: Principal | ICD-10-CM

## 2015-12-16 DIAGNOSIS — J189 Pneumonia, unspecified organism: Secondary | ICD-10-CM

## 2015-12-16 DIAGNOSIS — J9601 Acute respiratory failure with hypoxia: Secondary | ICD-10-CM

## 2015-12-16 LAB — BASIC METABOLIC PANEL
ANION GAP: 8 (ref 5–15)
BUN: <5 mg/dL — ABNORMAL LOW (ref 6–20)
CHLORIDE: 104 mmol/L (ref 101–111)
CO2: 24 mmol/L (ref 22–32)
CREATININE: 0.44 mg/dL (ref 0.44–1.00)
Calcium: 8.5 mg/dL — ABNORMAL LOW (ref 8.9–10.3)
GFR calc non Af Amer: >60 mL/min (ref >60)
Glucose, Bld: 108 mg/dL — ABNORMAL HIGH (ref 65–99)
POTASSIUM: 3.9 mmol/L (ref 3.5–5.1)
Sodium: 136 mmol/L (ref 135–145)

## 2015-12-16 MED ORDER — TOBRAMYCIN SULFATE 80 MG/2ML IJ SOLN
460.0000 mg | INTRAVENOUS | Status: DC
  Administered 2015-12-16 – 2015-12-17 (×2): 460 mg via INTRAVENOUS
  Filled 2015-12-16 (×3): qty 11.5

## 2015-12-16 MED ORDER — SODIUM CHLORIDE 0.9% FLUSH
10.0000 mL | INTRAVENOUS | Status: DC | PRN
  Administered 2015-12-17: 10 mL
  Filled 2015-12-16: qty 40

## 2015-12-16 NOTE — Progress Notes (Signed)
Pharmacy Antibiotic Note Anna Harper is a 20 y.o. female admitted on 12/12/2015 with CF exacerbation.  Pharmacy has been consulted for extended interval tobramycin dosing. Pt initially started on "traditional" tobramycin dosing; however, plan to switch to extended interval dosing at this time.    Plan: 1. Tobramycin 460 mg IV x 1 now (10 mg/kg) 2. Tobramycin levels at 1 hour and 5 hours post infusion 3. Based on levels/calculations will have further recs in am in regards to extended interval dosing  4. Following along with you  Height: 5\' 4"  (162.6 cm) Weight: 102 lb 14.4 oz (46.7 kg) IBW/kg (Calculated) : 54.7  Temp (24hrs), Avg:99.2 F (37.3 C), Min:98.5 F (36.9 C), Max:99.8 F (37.7 C)   Recent Labs Lab 12/12/15 2210 12/12/15 2220 12/13/15 0118 12/13/15 0417 12/14/15 0549 12/15/15 0615 12/15/15 1053 12/16/15 0551  WBC 13.8*  --   --   --   --  12.7*  --   --   CREATININE 0.55  --   --   --  0.47  --   --  0.44  LATICACIDVEN  --  1.45 1.0 0.7  --   --   --   --   VANCOTROUGH  --   --   --   --  6*  --  15  --     Estimated Creatinine Clearance: 82 mL/min (by C-G formula based on SCr of 0.8 mg/dL).    No Known Allergies  Antimicrobials this admission: 7/25 vanc>>  7/28 7/25 Zosyn>>7/28 7/26 fluc>> 7/28 Ceftaz >> 7/28 doxy >> 7/28 Tobramycin IV>>  Dose adjustments this admission: 7/29 switched from transitional to extended interval tobramycin dosing.   Microbiology results: 7/26 MRSA positive 7/26 urine>> multiple species, recollect suggested 7/26 Resp panel>> nothing detected 7/26 resp culture (trach asp) >> no predominant organisms isolated  7/26 BCx x 2>>ngtd  Thank you for allowing pharmacy to be a part of this patient's care.  Pollyann Samples, PharmD, BCPS 12/16/2015, 8:31 PM Pager: [phone]

## 2015-12-16 NOTE — Progress Notes (Signed)
Sputum obtained, labelled/sent to lab with requisition, RN aware.

## 2015-12-16 NOTE — Progress Notes (Signed)
Spoke with patient mother Norine on the telephone.She has concerns about patient being IV and INH tobramycin.She stated patient usually on one or the other not both at same time.According to Dr.Trimble order plan is for both at this time.Mother concerned about patient going in renal failure from getting too much of this medication.Patient mother also stated when patient on IV tobramycin she normally takes it every 12 hours not every 8 hours that's ordered here.This nurse spoke with pharmacist Barkley Bruns and discussed those concerns.Barkley Bruns stated medication is dose this admit according to current renal function.

## 2015-12-16 NOTE — Progress Notes (Signed)
Peripherally Inserted Central Catheter/Midline Placement  The IV Nurse has discussed with the patient and/or persons authorized to consent for the patient, the purpose of this procedure and the potential benefits and risks involved with this procedure.  The benefits include less needle sticks, lab draws from the catheter and patient may be discharged home with the catheter.  Risks include, but not limited to, infection, bleeding, blood clot (thrombus formation), and puncture of an artery; nerve damage and irregular heat beat.  Alternatives to this procedure were also discussed.  PICC/Midline Placement Documentation        Virgene Tirone, Lajean Manes 12/16/2015, 10:58 AM

## 2015-12-16 NOTE — Progress Notes (Signed)
PROGRESS NOTE                                                                                                                                                                                                             Patient Demographics:    Anna Harper, is a 20 y.o. female, DOB - 06-29-1995, FHL:[id number]  Admit date - 12/12/2015   Admitting Physician Lorretta Harp, MD  Outpatient Primary MD for the patient is Maxwell Caul, MD  LOS - 3  Outpatient Specialists: Dr. Camillo Flaming in [facility] cystic fibrosis  Chief Complaint  Patient presents with  . Fever  . Emesis  . Chest Pain       Brief Narrative   20 year old female with history of cystic fibrosis who follows at [facility], history of left pneumothorax and recently had all 4 wisdom tooth removed one week back presented to the ED with productive cough, shortness of breath and chest tightness in the past 3-4 days. She also reported subjective fever with chills. Patient had severe bouts of coughing with 2 episode of vomiting. Denied any headache, abdominal pain, diarrhea, dysuria. Patient was found to have SIRS with sinus tachycardia, WBC of 13.8 and O2 sat of 90% on room air. Remaining labs are stable. Chest excisional chronic cystic fibrosis changes with interval progression with interstitial scarring and bronchitic changes, no focal consolidation. Patient admitted to telemetry.   Subjective:   Tmax of 102F yesterday.has hoarseness ov voice which she attributes to to tobramycin.   Assessment  & Plan :    Principal Problem:   Acute respiratory failure with hypoxia (HCC) Possibly cystic fibrosis flareup versus aspiration pneumonia from recent wisdom teeth removal. Patient reports coughing and may have aspirated during the procedure. Sats stable. Discussed with Durenda Hurt , PA with Dr Camillo Flaming ( patient's pulmonologist at [facility]). Patient evaluated by pulmonary who  after discussing with patient pulmonologist placed her ceftazidime/tobramycin with doxycycline for MRSA coverage. PICC line placed this morning. Cultures have been negative. Continue supportive care and home nebulizer and inhaler. Pulmonary reevaluated today. Given concern for renal failure recommend to monitor on tobramycin for now and if no concern for gram-negative resistant pathogen would discontinue it. Renal function stable. Plan is for 2 weeks of IV antibiotics.  Active Problems:   Hx of cystic fibrosis Continue hypertonic normal saline nebulizer solution  twice daily. Resumed Creon, sodium chloride inhalant 7% bid and Pulmozyme. Appreciate pulmonary recommendations.  Sinus tachycardia Both patient and her mother report she has significant tachycardia during hospital visit that potentially is triggered with anxiety. Heart is stable in the 130s. Continue IV hydration.  Oral thrush Improved with fluconazole     Microcytic anemia Stable.  Anemia Drop in h&H from admission. Low MCV. Will monitor for now.     Code Status : Full code  Family Communication  : Father at bedside  Disposition Plan  : Home if stable and after PFT on 7/31  Barriers For Discharge : Improving symptoms  Consults  :  None  Procedures  : None  DVT Prophylaxis  :  Lovenox -   Lab Results  Component Value Date   PLT 465 (H) 12/15/2015    Antibiotics  :   Anti-infectives    Start     Dose/Rate Route Frequency Ordered Stop   12/16/15 0500  tobramycin (NEBCIN) 70 mg in dextrose 5 % 50 mL IVPB     70 mg 103.5 mL/hr over 30 Minutes Intravenous Every 8 hours 12/15/15 2043     12/15/15 2100  cefTAZidime (FORTAZ) 2 g in dextrose 5 % 50 mL IVPB     2 g 100 mL/hr over 30 Minutes Intravenous Every 8 hours 12/15/15 2007     12/15/15 2100  tobramycin (NEBCIN) 90 mg in dextrose 5 % 50 mL IVPB     2 mg/kg  46.7 kg 104.5 mL/hr over 30 Minutes Intravenous  Once 12/15/15 2043 12/15/15 2139   12/15/15  2045  doxycycline (VIBRA-TABS) tablet 100 mg     100 mg Oral Every 12 hours 12/15/15 2007     12/15/15 2015  tobramycin (NEBCIN) 330 mg in dextrose 5 % 100 mL IVPB  Status:  Discontinued     7 mg/kg  46.7 kg 108.3 mL/hr over 60 Minutes Intravenous Every 24 hours 12/15/15 2007 12/15/15 2039   12/14/15 1200  vancomycin (VANCOCIN) 1,250 mg in sodium chloride 0.9 % 250 mL IVPB  Status:  Discontinued     1,250 mg 166.7 mL/hr over 90 Minutes Intravenous Every 8 hours 12/14/15 0713 12/15/15 2007   12/13/15 1015  fluconazole (DIFLUCAN) tablet 200 mg     200 mg Oral Daily 12/13/15 1005     12/13/15 1000  azithromycin (ZITHROMAX) tablet 500 mg  Status:  Discontinued    Comments:  Monday, Wednesday and Friday     500 mg Oral Once per day on Mon Wed Fri 12/13/15 0057 12/15/15 2007   12/13/15 0600  vancomycin (VANCOCIN) IVPB 750 mg/150 ml premix  Status:  Discontinued     750 mg 150 mL/hr over 60 Minutes Intravenous Every 8 hours 12/13/15 0321 12/14/15 0712   12/13/15 0600  piperacillin-tazobactam (ZOSYN) IVPB 3.375 g  Status:  Discontinued     3.375 g 12.5 mL/hr over 240 Minutes Intravenous Every 8 hours 12/13/15 0321 12/15/15 2007   12/13/15 0300  tobramycin (PF) (TOBI) nebulizer solution 300 mg    Comments:  Take for 30 days then hold for 30.     300 mg Nebulization 2 times daily 12/13/15 0057     12/12/15 2330  ceFEPIme (MAXIPIME) 2 g in dextrose 5 % 50 mL IVPB     2 g 100 mL/hr over 30 Minutes Intravenous  Once 12/12/15 2326 12/13/15 0100   12/12/15 2330  vancomycin (VANCOCIN) IVPB 1000 mg/200 mL premix     1,000 mg 200  mL/hr over 60 Minutes Intravenous  Once 12/12/15 2326 12/13/15 0200        Objective:   Vitals:   12/16/15 0256 12/16/15 0543 12/16/15 0925 12/16/15 0948  BP:  110/70 103/65   Pulse:  (!) 113 (!) 117   Resp:  14 15   Temp:  99.3 F (37.4 C) 99.2 F (37.3 C)   TempSrc:  Oral Oral   SpO2: 94% 98% 95% 93%  Weight:      Height:        Wt Readings from Last 3  Encounters:  12/14/15 46.7 kg (102 lb 14.4 oz)  03/15/14 41.6 kg (91 lb 12 oz) (<1 %, Z < -2.33)*  11/16/11 38.4 kg (84 lb 11.2 oz) (<1 %, Z < -2.33)*   * Growth percentiles are based on CDC 2-20 Years data.     Intake/Output Summary (Last 24 hours) at 12/16/15 1311 Last data filed at 12/16/15 0926  Gross per 24 hour  Intake          2833.33 ml  Output              900 ml  Net          1933.33 ml     Physical Exam  Gen: not in distress HEENT:  moist mucosa, Oral thrush improved, supple neck Chest: Improved breath sounds at lung bases (minimal crackles) CVS:  S1&S2 tachycardic, no murmurs GI: soft, NT, ND, BS+ Musculoskeletal: warm, no edema     Data Review:    CBC  Recent Labs Lab 12/12/15 2210 12/15/15 0615  WBC 13.8* 12.7*  HGB 12.0 9.4*  HCT 39.8 31.2*  PLT PLATELETS APPEAR ADEQUATE 465*  MCV 69.2* 70.1*  MCH 20.9* 21.1*  MCHC 30.2 30.1  RDW 16.8* 17.2*  LYMPHSABS 1.9  --   MONOABS 1.4*  --   EOSABS 0.0  --   BASOSABS 0.0  --     Chemistries   Recent Labs Lab 12/12/15 2210 12/14/15 0549 12/16/15 0551  NA 132* 134* 136  K 4.0 3.8 3.9  CL 96* 100* 104  CO2 25 26 24   GLUCOSE 150* 100* 108*  BUN <5* <5* <5*  CREATININE 0.55 0.47 0.44  CALCIUM 9.3 8.2* 8.5*  MG  --  1.9  --   AST 19  --   --   ALT 13*  --   --   ALKPHOS 83  --   --   BILITOT 0.4  --   --    ------------------------------------------------------------------------------------------------------------------ No results for input(s): CHOL, HDL, LDLCALC, TRIG, CHOLHDL, LDLDIRECT in the last 72 hours.  No results found for: HGBA1C ------------------------------------------------------------------------------------------------------------------ No results for input(s): TSH, T4TOTAL, T3FREE, THYROIDAB in the last 72 hours.  Invalid input(s): FREET3 ------------------------------------------------------------------------------------------------------------------ No results for  input(s): VITAMINB12, FOLATE, FERRITIN, TIBC, IRON, RETICCTPCT in the last 72 hours.  Coagulation profile  Recent Labs Lab 12/13/15 0118  INR 1.38    No results for input(s): DDIMER in the last 72 hours.  Cardiac Enzymes No results for input(s): CKMB, TROPONINI, MYOGLOBIN in the last 168 hours.  Invalid input(s): CK ------------------------------------------------------------------------------------------------------------------ No results found for: BNP  Inpatient Medications  Scheduled Meds: . cefTAZidime (FORTAZ)  IV  2 g Intravenous Q8H  . Chlorhexidine Gluconate Cloth  6 each Topical Q0600  . dextromethorphan-guaiFENesin  1 tablet Oral BID  . dornase alpha  2.5 mg Nebulization Daily  . doxycycline  100 mg Oral Q12H  . enoxaparin (LOVENOX) injection  30 mg Subcutaneous Daily  .  feeding supplement (ENSURE ENLIVE)  237 mL Oral Q1500  . fluconazole  200 mg Oral Daily  . levalbuterol  1.25 mg Nebulization Q6H  . lipase/protease/amylase (CREON) 120,000 Units  120,000 Units Oral TID WC  . mupirocin ointment  1 application Nasal BID  . pantoprazole  40 mg Oral Daily  . Sodium Chloride (Inhalant)  1 ampule Inhalation BID  . tobramycin  70 mg Intravenous Q8H  . tobramycin (PF)  300 mg Nebulization BID   Continuous Infusions: . sodium chloride 100 mL/hr at 12/15/15 2004   PRN Meds:.acetaminophen, lipase/protease/amylase, menthol-cetylpyridinium, ondansetron, sodium chloride flush  Micro Results Recent Results (from the past 240 hour(s))  Blood Culture (routine x 2)     Status: None (Preliminary result)   Collection Time: 12/13/15 12:00 AM  Result Value Ref Range Status   Specimen Description BLOOD LEFT ARM  Final   Special Requests BOTTLES DRAWN AEROBIC AND ANAEROBIC  Final   Culture NO GROWTH 3 DAYS  Final   Report Status PENDING  Incomplete  Blood Culture (routine x 2)     Status: None (Preliminary result)   Collection Time: 12/13/15 12:08 AM  Result Value Ref  Range Status   Specimen Description BLOOD LEFT ARM  Final   Special Requests IN PEDIATRIC BOTTLE  Final   Culture NO GROWTH 3 DAYS  Final   Report Status PENDING  Incomplete  Culture, respiratory (NON-Expectorated)     Status: None   Collection Time: 12/13/15 12:21 AM  Result Value Ref Range Status   Specimen Description TRACHEAL ASPIRATE  Final   Special Requests NONE  Final   Gram Stain   Final    FEW WBC PRESENT, PREDOMINANTLY PMN ABUNDANT GRAM POSITIVE COCCI IN CLUSTERS IN PAIRS MODERATE GRAM NEGATIVE COCCOBACILLI    Culture MULTIPLE ORGANISMS PRESENT, NONE PREDOMINANT  Final   Report Status 12/15/2015 FINAL  Final  Urine culture     Status: Abnormal   Collection Time: 12/13/15  1:01 AM  Result Value Ref Range Status   Specimen Description URINE, RANDOM  Final   Special Requests NONE  Final   Culture MULTIPLE SPECIES PRESENT, SUGGEST RECOLLECTION (A)  Final   Report Status 12/14/2015 FINAL  Final  MRSA PCR Screening     Status: Abnormal   Collection Time: 12/13/15  2:13 AM  Result Value Ref Range Status   MRSA by PCR POSITIVE (A) NEGATIVE Final    Comment:        The GeneXpert MRSA Assay (FDA approved for NASAL specimens only), is one component of a comprehensive MRSA colonization surveillance program. It is not intended to diagnose MRSA infection nor to guide or monitor treatment for MRSA infections. RESULT CALLED TO, READ BACK BY AND VERIFIED WITH: M [facility] @0826  12/13/15 MKELLY   Respiratory Panel by PCR     Status: None   Collection Time: 12/13/15  4:42 AM  Result Value Ref Range Status   Adenovirus NOT DETECTED NOT DETECTED Final   Coronavirus 229E NOT DETECTED NOT DETECTED Final   Coronavirus HKU1 NOT DETECTED NOT DETECTED Final   Coronavirus NL63 NOT DETECTED NOT DETECTED Final   Coronavirus OC43 NOT DETECTED NOT DETECTED Final   Metapneumovirus NOT DETECTED NOT DETECTED Final   Rhinovirus / Enterovirus NOT DETECTED NOT DETECTED Final   Influenza A  NOT DETECTED NOT DETECTED Final   Influenza A H1 NOT DETECTED NOT DETECTED Final   Influenza A H1 2009 NOT DETECTED NOT DETECTED Final   Influenza A H3  NOT DETECTED NOT DETECTED Final   Influenza B NOT DETECTED NOT DETECTED Final   Parainfluenza Virus 1 NOT DETECTED NOT DETECTED Final   Parainfluenza Virus 2 NOT DETECTED NOT DETECTED Final   Parainfluenza Virus 3 NOT DETECTED NOT DETECTED Final   Parainfluenza Virus 4 NOT DETECTED NOT DETECTED Final   Respiratory Syncytial Virus NOT DETECTED NOT DETECTED Final   Bordetella pertussis NOT DETECTED NOT DETECTED Final   Chlamydophila pneumoniae NOT DETECTED NOT DETECTED Final   Mycoplasma pneumoniae NOT DETECTED NOT DETECTED Final    Radiology Reports Dg Chest 2 View  Result Date: 12/12/2015 CLINICAL DATA:  20 year old female with fever cough and left-sided chest pain. History of cystic fibrosis. EXAM: CHEST  2 VIEW COMPARISON:  Chest radiograph dated 11/16/2011 FINDINGS: There are chronic interstitial coarsening and prominence with areas of the cystic changes throughout the lungs compatible with chronic changes of cystic fibrosis. There has been interval increased in the diffuse interstitial coarsening and bronchiectatic changes are with wall thickened walls compared to the prior study. Superimposed pneumonia is not excluded. Clinical correlation is recommended. There is no focal consolidation. No pleural effusion or pneumothorax noted. The cardiac silhouette is within normal limits. No acute osseous pathology identified. IMPRESSION: Chronic changes of cystic fibrosis with interval progression of interstitial scarring and bronchiectatic changes compared to the prior study. No focal consolidation. Electronically Signed   By: Elgie Collard M.D.   On: 12/12/2015 22:37   Time Spent in minutes  25   Eddie North M.D on 12/16/2015 at 1:11 PM  Between 7am to 7pm - Pager - [phone]  After 7pm go to [email or web address] - password [facility]  [facility] -  Office  [phone]

## 2015-12-16 NOTE — Progress Notes (Signed)
Placed patient oxygen placed at 2 liters nasal cannula for rest of the night .Oxygen saturations dropping upper 80's while sleeping.

## 2015-12-16 NOTE — Progress Notes (Addendum)
Name: Anna Harper MRN: [id number] DOB: [birth year]    ADMISSION DATE:  12/12/2015 CONSULTATION DATE:  12/15/15  REFERRING MD :  Dr. Gonzella Harper   CHIEF COMPLAINT:  Acute Exacerbation of CF    SUBJECTIVE:  Mother concerned regarding renal function / possible injury with inhaled and IV tobra use.  RN notes reflect the patient desaturated overnight while sleeping into the 80's. 2L O2 applied. Pt denies complaints.  Tmax 99.2   VITAL SIGNS: Temp:  [98.5 F (36.9 C)-99.5 F (37.5 C)] 99.2 F (37.3 C) (07/29 0925) Pulse Rate:  [113-143] 117 (07/29 0925) Resp:  [14-18] 15 (07/29 0925) BP: (103-120)/(63-70) 103/65 (07/29 0925) SpO2:  [92 %-98 %] 93 % (07/29 0948)  PHYSICAL EXAMINATION: General:  Thin young adult female in NAD  Neuro:  AAOx4, speech clear, MAE  HEENT:  MM pink/moist, no jvd  Cardiovascular:  s1s2 rrr, no m/r/g  Lungs:  Even/non-labored, lungs bilaterally clear  Abdomen:  Soft, non-tender, bsx4 active  Musculoskeletal:  No acute deformities  Skin:  Warm/dry, no edema   Recent Labs Lab 12/12/15 2210 12/14/15 0549 12/16/15 0551  NA 132* 134* 136  K 4.0 3.8 3.9  CL 96* 100* 104  CO2 BUN <5* <5* <5*  CREATININE 0.55 0.47 0.44  GLUCOSE 150* 100* 108*    Recent Labs Lab 12/12/15 2210 12/15/15 0615  HGB 12.0 9.4*  HCT 39.8 31.2*  WBC 13.8* 12.7*  PLT PLATELETS APPEAR ADEQUATE 465*   No results found.   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 Camillo Harper & Anna Hurt Rupchich, PA. The patient underwent routine 3rd molar extraction 10 days ago and during the procedure seemed to have had some sort of aspiration event. About 2-3 days later, she developed worsening cough, fevers (not an uncommon symptom for her) and pleuritic pain over L posterolateral chest wall, and dyspnea. She presented to [facility] and was admitted. She was treated  empirically for pneumonia, although her imaging did not show a focal area of airspace disease beyond typical CF changes. Cultures were negative, save for a sputum cx which grew multiple species (GPCs and GNRs) and was not further cultured.  ASSESSMENT / PLAN:  CF with acute exacerbation - prior culture history reviewed, typically grows MRSA and Pseudomonas, with good susceptibilities.    Plan: Initially seen by Fellow who follows CF at [facility] Antibiotics changed to ceftaz/tobra + doxy for MRSA. PICC placement pending Spirometry ordered Anticipate she can d/c home once PICC arranged / and if remains afebrile to complete two week course of ceftaz / tobra + doxy.  Will f/u with Dr. Camillo Harper @ [facility] (Fellow will arrange). Cont. Inh tobra / HS / pulmozyme. Cont. Enzymes. Fellow discussed next gen modulators. Pt will need to increase adherence. Follow up at [facility].    Canary Brim, NP-C [place] [facility] Pgr: [phone] or if no answer [phone] 12/16/2015, 10:06 AM  STAFF NOTE: I, Anna Percy, MD FACP have personally reviewed patient's available data, including medical history, events of note, physical examination and test results as part of my evaluation. I have discussed with resident/NP and other care providers such as pharmacist, RN and RRT. In addition, I personally evaluated patient and elicited key findings of: no distress, she feels better, no further chest pain on left, crackles less coarse as what was described,  no fullness mandible or jaw, no fevers, she is coughing up secretions well, family is concerned about aminoglycoside IV with prior daughter developing renal failure with "extra " dose gent at [facility] [address], will maintain this until 48 hrs and if no gram neg resistant pathogens would dc, maintain inhaled and doxy and cefepime, will follow culture, I do believe there was an asp event per history after tooth extraction, no evidence on exam for mand abscess, I updated dad and pt, pcxr  to follow to ensure no development lobar consolidation, mobilize secretions as able, follow renal fxn closely, keep maintenance fluids   Anna Harper. Anna Alias, MD, FACP Pgr: [phone] [place] [facility] 12/16/2015 12:36 PM

## 2015-12-16 NOTE — Progress Notes (Signed)
[facility] CF Provider Note:  Patient has KNOWN history of chronic pseudomonas infection. Standard of care is for double coverage with aminoglycoside plus anti-PSA beta-lactam antibiotic. Tobramycin (high peak, once-daily dosing) SHOULD be continued. However, given concern for toxicity, will discontinue inhaled tobramycin.  Repeat sputum culture with note to lab to speciate all observed colonies.  Plan for d/c Monday, [facility] will be responsible for following/manging home IV drugs.  Jamie Kato, MD [facility]) Pulmonary & Critical Care Medicine December 16, 2015, 7:24 PM

## 2015-12-17 ENCOUNTER — Inpatient Hospital Stay (HOSPITAL_COMMUNITY): Payer: 59

## 2015-12-17 LAB — BASIC METABOLIC PANEL
ANION GAP: 7 (ref 5–15)
BUN: <5 mg/dL — ABNORMAL LOW (ref 6–20)
CALCIUM: 8.5 mg/dL — AB (ref 8.9–10.3)
CHLORIDE: 102 mmol/L (ref 101–111)
CO2: 29 mmol/L (ref 22–32)
Creatinine, Ser: 0.49 mg/dL (ref 0.44–1.00)
GFR calc non Af Amer: >60 mL/min (ref >60)
Glucose, Bld: 115 mg/dL — ABNORMAL HIGH (ref 65–99)
POTASSIUM: 3.6 mmol/L (ref 3.5–5.1)
Sodium: 138 mmol/L (ref 135–145)

## 2015-12-17 LAB — TOBRAMYCIN LEVEL, RANDOM
TOBRAMYCIN RM: 23.4 ug/mL — AB
Tobramycin Rm: 5.1 ug/mL

## 2015-12-17 LAB — EXPECTORATED SPUTUM ASSESSMENT W REFEX TO RESP CULTURE

## 2015-12-17 LAB — CBC
HCT: 30.2 % — ABNORMAL LOW (ref 36.0–46.0)
HEMOGLOBIN: 8.6 g/dL — AB (ref 12.0–15.0)
MCH: 20 pg — AB (ref 26.0–34.0)
MCHC: 28.5 g/dL — ABNORMAL LOW (ref 30.0–36.0)
MCV: 70.1 fL — AB (ref 78.0–100.0)
Platelets: 459 10*3/uL — ABNORMAL HIGH (ref 150–400)
RBC: 4.31 MIL/uL (ref 3.87–5.11)
RDW: 17.2 % — ABNORMAL HIGH (ref 11.5–15.5)
WBC: 9.6 10*3/uL (ref 4.0–10.5)

## 2015-12-17 LAB — EXPECTORATED SPUTUM ASSESSMENT W GRAM STAIN, RFLX TO RESP C

## 2015-12-17 MED ORDER — PANCRELIPASE (LIP-PROT-AMYL) 12000-38000 UNITS PO CPEP: 120000.0000 [IU] | ORAL_CAPSULE | Freq: Three times a day (TID) | ORAL | Status: DC

## 2015-12-17 NOTE — Progress Notes (Signed)
CM received call from RN requesting home health arrangement for IV ABX at 16:46 pm.  Cm explained no home health agency will be available to take referral as it is too close to 17:00.  CM notes no consult has been placed and has now placed CM consult with pt's choice of agency in [place].  Weekday CM will follow.

## 2015-12-17 NOTE — Progress Notes (Signed)
Name: Anna Harper MRN: [id number] DOB: [birth year]    ADMISSION DATE:  12/12/2015 CONSULTATION DATE:  12/15/15  REFERRING MD :  Dr. Gonzella Lex   CHIEF COMPLAINT:  Acute Exacerbation of CF    SUBJECTIVE:  PICC line placed 7/29. Pt reports feeling better, dry cough.  Denies fevers / chills.   VITAL SIGNS: Temp:  [97.6 F (36.4 C)-100.7 F (38.2 C)] 97.9 F (36.6 C) (07/30 1025) Pulse Rate:  [96-135] 117 (07/30 1025) Resp:  [16-19] 16 (07/30 1025) BP: (98-110)/(61-68) 98/61 (07/30 1025) SpO2:  [92 %-97 %] 96 % (07/30 1025) FiO2 (%):  [92 %] 92 % (07/29 2052)  PHYSICAL EXAMINATION: General:  [name] in NAD  Neuro:  AAOx4, speech clear, MAE  HEENT:  MM pink/moist, no jvd  Cardiovascular:  s1s2 rrr, no m/r/g  Lungs:  Even/non-labored, lungs bilaterally clear  Abdomen:  Soft, non-tender, bsx4 active  Musculoskeletal:  No acute deformities  Skin:  Warm/dry, no edema   Recent Labs Lab 12/14/15 0549 12/16/15 0551 12/17/15 0413  NA 134* 136 138  K 3.8 3.9 3.6  CL 100* 104 102  CO2 BUN <5* <5* <5*  CREATININE 0.47 0.44 0.49  GLUCOSE 100* 108* 115*    Recent Labs Lab 12/12/15 2210 12/15/15 0615 12/17/15 0413  HGB 12.0 9.4* 8.6*  HCT 39.8 31.2* 30.2*  WBC 13.8* 12.7* 9.6  PLT PLATELETS APPEAR ADEQUATE 465* 459*   Dg Chest Port 1 View  Result Date: 12/17/2015 CLINICAL DATA:  Aspiration pneumonia.  Cystic fibrosis. EXAM: PORTABLE CHEST 1 VIEW COMPARISON:  12/12/2015 and 11/16/2011 chest radiographs FINDINGS: Chronic interstitial changes throughout both lungs again noted and compatible with cystic fibrosis changes. Bilateral upper lung scarring again noted. No definite evidence of airspace disease, pneumothorax or pleural effusions. A right PICC line is present with tip overlying the lower SVC. No acute bony abnormalities are present. IMPRESSION: Chronic interstitial lung disease compatible with history of cystic fibrosis. Superimposed  aspiration or pneumonia is difficult to entirely exclude. Right PICC line with tip overlying the lower SVC. Electronically Signed   By: Harmon Pier M.D.   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 Camillo Flaming & Durenda Hurt Rupchich, PA. The patient underwent routine 3rd molar extraction 10 days ago and during the procedure seemed to have had some sort of aspiration event. About 2-3 days later, she developed worsening cough, fevers (not an uncommon symptom for her) and pleuritic pain over L posterolateral chest wall, and dyspnea. She presented to [facility] and was admitted. She was treated empirically for pneumonia, although her imaging did not show a focal area of airspace disease beyond typical CF changes. Cultures were negative, save for a sputum cx which grew multiple species (GPCs and GNRs) and was not further cultured.  ASSESSMENT / PLAN:  CF with acute exacerbation - prior culture history reviewed, typically grows MRSA and Pseudomonas, with good susceptibilities.    Plan: Initially seen by Fellow who follows CF at [facility] > continues to follow from periphery.  Recommends double coverage with aminoglycoside + anti-pseudomonal beta lactam.   D/C inhaled tobramycin Continue ceftaz/tobra + doxy for MRSA. Will need home health for PICC / IV abx Spirometry pending Anticipate she can d/c home once home health arranged / and if remains  afebrile to complete two week course of ceftaz / tobra + doxy.  Will f/u with Dr. Camillo Flaming @ [facility] (Fellow will arrange). Cont. Enzymes. Fellow discussed next gen modulators. Pt will need to increase adherence. Follow up at [facility].    Canary Brim, NP-C [place] [facility] Pgr: [phone] or if no answer [phone] 12/17/2015, 12:21 PM   STAFF NOTE: I, Rory Percy, MD FACP have personally reviewed patient's  available data, including medical history, events of note, physical examination and test results as part of my evaluation. I have discussed with resident/NP and other care providers such as pharmacist, RN and RRT. In addition, I personally evaluated patient and elicited key findings of: improved resp status, less coarse, no pleuritic CP further, no fevers, pcxr unchanged chronic int changes, culture remains neg, [facility] recs to follow with tobi and cef, picc in place, dc tobi inhaled abx, will need close follow up renal fxn given dad and pt concern and prior sisters experience, she walked and did not desat, secretions at baseline, would send home if home  Health available today, [facility] to determine total abx duration Will sign off, call if needed To follow up at [facility] J. Tyson Alias, MD, FACP Pgr: [phone] [place] [facility] 12/17/2015 2:34 PM

## 2015-12-17 NOTE — Progress Notes (Signed)
Pharmacy Antibiotic Note Anna Harper is a 20 y.o. female admitted on 12/12/2015 with CF exacerbation.  Pharmacy has been consulted for extended interval tobramycin dosing. Pt initially started on "traditional" tobramycin dosing but was switched to extended interval dosing the evening of 7/29. Tobramycin 460 mg IV (10 mg/kg) was given x1 on 7/29.  -Tobramycin lvl 1-hour post infusion was 23.4 ug/mL  -The lvl 6 hours post infusion was 5.1 ug/mL -The ke was calculated to be 0.286 -The half-life was calculated to be 2.4 hours, which results in a 24-hour dosing interval -The extrapolated Cmax was calculated to be 27 ug/mL, which is at goal of 20-30 ug/mL  Plan: 1. Continue Tobramycin 460 mg IV Q24H 2. Check levels again in 5 days (after dose on 8/3) 3. Follow patient progress and renal function  Height: 5\' 4"  (162.6 cm) Weight: 102 lb 14.4 oz (46.7 kg) IBW/kg (Calculated) : 54.7  Temp (24hrs), Avg:99.3 F (37.4 C), Min:97.6 F (36.4 C), Max:100.7 F (38.2 C)   Recent Labs Lab 12/12/15 2210 12/12/15 2220 12/13/15 0118 12/13/15 0417 12/14/15 0549 12/15/15 0615 12/15/15 1053 12/16/15 0551 12/16/15 2254 12/17/15 0413  WBC 13.8*  --   --   --   --  12.7*  --   --   --  9.6  CREATININE 0.55  --   --   --  0.47  --   --  0.44  --  0.49  LATICACIDVEN  --  1.45 1.0 0.7  --   --   --   --   --   --   VANCOTROUGH  --   --   --   --  6*  --  15  --   --   --   TOBRARND  --   --   --   --   --   --   --   --  23.4* 5.1    Estimated Creatinine Clearance: 82 mL/min (by C-G formula based on SCr of 0.8 mg/dL).    No Known Allergies  Antimicrobials this admission: 7/25 vanc>>  7/28 7/25 Zosyn>>7/28 7/26 fluc>> 7/28 Ceftaz >> 7/28 doxy >> 7/28 Tobramycin IV>>  Dose adjustments this admission: 7/29 switched from transitional to extended interval tobramycin dosing.   Microbiology results: 7/26 MRSA positive 7/26 urine>> multiple species, recollect suggested 7/26 Resp panel>>  nothing detected 7/26 resp culture (trach asp) >> no predominant organisms isolated  7/26 BCx x 2>>ngtd  Thank you for allowing pharmacy to be a part of this patient's care.  Gwyndolyn Kaufman Bernette Redbird), PharmD  PGY1 Pharmacy Resident Pager: [phone] 12/17/2015 7:44 AM

## 2015-12-17 NOTE — Progress Notes (Signed)
PROGRESS NOTE                                                                                                                                                                                                             Patient Demographics:    Anna Harper, is a 20 y.o. female, DOB - 1996-04-07, ZOX:[id number]  Admit date - 12/12/2015   Admitting Physician Lorretta Harp, MD  Outpatient Primary MD for the patient is Maxwell Caul, MD  LOS - 4  Outpatient Specialists: Dr. Camillo Flaming in [facility] cystic fibrosis  Chief Complaint  Patient presents with  . Fever  . Emesis  . Chest Pain       Brief Narrative   20 year old female with history of cystic fibrosis who follows at [facility], history of left pneumothorax and recently had all 4 wisdom tooth removed one week back presented to the ED with productive cough, shortness of breath and chest tightness in the past 3-4 days. She also reported subjective fever with chills. Patient had severe bouts of coughing with 2 episode of vomiting. Denied any headache, abdominal pain, diarrhea, dysuria. Patient was found to have SIRS with sinus tachycardia, WBC of 13.8 and O2 sat of 90% on room air. Remaining labs are stable. Chest excisional chronic cystic fibrosis changes with interval progression with interstitial scarring and bronchitic changes, no focal consolidation. Patient admitted to telemetry.   Subjective:   Tmax of 100.63F yesterday.Feels much better.   Assessment  & Plan :    Principal Problem:   Acute respiratory failure with hypoxia (HCC) Possibly cystic fibrosis flareup versus aspiration pneumonia from recent wisdom teeth removal. Patient reports coughing and may have aspirated during the procedure. -Appreciate pulmonary recommendations. Antibiotic switched to ceftazidime and tobramycin with 2 weeks course. PICC line placed. -Blood cultures negative. Repeat sputum  culture sent. Continue supportive care and home nebulizer and inhaler. Discontinue tobramycin inhaler. -Patient awaiting PFT as per her pulmonologist recommendation. This will be done tomorrow and she can be discharged home with home health RN. She has follow-up with Dr. Clent Ridges at [facility] next week.  Active Problems:   Hx of cystic fibrosis Continue hypertonic normal saline nebulizer solution twice daily. Resumed Creon, sodium chloride inhalant 7% bid and Pulmozyme. Appreciate pulmonary follow-up.  Sinus tachycardia Both patient and her mother report she has significant tachycardia during  hospital visit that potentially is triggered with anxiety. Currently stable. DC fluids.  Oral thrush Improved with fluconazole     Microcytic anemia Stable.  Anemia Drop in h&H from admission. Low MCV. Will monitor for now.     Code Status : Full code  Family Communication  : Father at bedside  Disposition Plan  : Home if stable and after PFT on 7/31  Barriers For Discharge : Improving symptoms  Consults  :  None  Procedures  : None  DVT Prophylaxis  :  Lovenox -   Lab Results  Component Value Date   PLT 459 (H) 12/17/2015    Antibiotics  :   Anti-infectives    Start     Dose/Rate Route Frequency Ordered Stop   12/16/15 2100  tobramycin (NEBCIN) 460 mg in dextrose 5 % 100 mL IVPB     460 mg 111.5 mL/hr over 60 Minutes Intravenous Every 24 hours 12/16/15 2022     12/16/15 0500  tobramycin (NEBCIN) 70 mg in dextrose 5 % 50 mL IVPB  Status:  Discontinued     70 mg 103.5 mL/hr over 30 Minutes Intravenous Every 8 hours 12/15/15 2043 12/16/15 2022   12/15/15 2100  cefTAZidime (FORTAZ) 2 g in dextrose 5 % 50 mL IVPB     2 g 100 mL/hr over 30 Minutes Intravenous Every 8 hours 12/15/15 2007     12/15/15 2100  tobramycin (NEBCIN) 90 mg in dextrose 5 % 50 mL IVPB     2 mg/kg  46.7 kg 104.5 mL/hr over 30 Minutes Intravenous  Once 12/15/15 2043 12/15/15 2139   12/15/15 2045  doxycycline  (VIBRA-TABS) tablet 100 mg     100 mg Oral Every 12 hours 12/15/15 2007     12/15/15 2015  tobramycin (NEBCIN) 330 mg in dextrose 5 % 100 mL IVPB  Status:  Discontinued     7 mg/kg  46.7 kg 108.3 mL/hr over 60 Minutes Intravenous Every 24 hours 12/15/15 2007 12/15/15 2039   12/14/15 1200  vancomycin (VANCOCIN) 1,250 mg in sodium chloride 0.9 % 250 mL IVPB  Status:  Discontinued     1,250 mg 166.7 mL/hr over 90 Minutes Intravenous Every 8 hours 12/14/15 0713 12/15/15 2007   12/13/15 1015  fluconazole (DIFLUCAN) tablet 200 mg     200 mg Oral Daily 12/13/15 1005     12/13/15 1000  azithromycin (ZITHROMAX) tablet 500 mg  Status:  Discontinued    Comments:  Monday, Wednesday and Friday     500 mg Oral Once per day on Mon Wed Fri 12/13/15 0057 12/15/15 2007   12/13/15 0600  vancomycin (VANCOCIN) IVPB 750 mg/150 ml premix  Status:  Discontinued     750 mg 150 mL/hr over 60 Minutes Intravenous Every 8 hours 12/13/15 0321 12/14/15 0712   12/13/15 0600  piperacillin-tazobactam (ZOSYN) IVPB 3.375 g  Status:  Discontinued     3.375 g 12.5 mL/hr over 240 Minutes Intravenous Every 8 hours 12/13/15 0321 12/15/15 2007   12/13/15 0300  tobramycin (PF) (TOBI) nebulizer solution 300 mg  Status:  Discontinued    Comments:  Take for 30 days then hold for 30.     300 mg Nebulization 2 times daily 12/13/15 0057 12/16/15 1922   12/12/15 2330  ceFEPIme (MAXIPIME) 2 g in dextrose 5 % 50 mL IVPB     2 g 100 mL/hr over 30 Minutes Intravenous  Once 12/12/15 2326 12/13/15 0100   12/12/15 2330  vancomycin (VANCOCIN) IVPB 1000  mg/200 mL premix     1,000 mg 200 mL/hr over 60 Minutes Intravenous  Once 12/12/15 2326 12/13/15 0200        Objective:   Vitals:   12/17/15 0240 12/17/15 0414 12/17/15 0805 12/17/15 1025  BP:  104/68  98/61  Pulse:  96  (!) 117  Resp:  17  16  Temp:  97.6 F (36.4 C)  97.9 F (36.6 C)  TempSrc:  Oral  Oral  SpO2: 95% 97% 97% 96%  Weight:      Height:        Wt Readings from  Last 3 Encounters:  12/14/15 46.7 kg (102 lb 14.4 oz)  03/15/14 41.6 kg (91 lb 12 oz) (<1 %, Z < -2.33)*  11/16/11 38.4 kg (84 lb 11.2 oz) (<1 %, Z < -2.33)*   * Growth percentiles are based on CDC 2-20 Years data.     Intake/Output Summary (Last 24 hours) at 12/17/15 1356 Last data filed at 12/17/15 0131  Gross per 24 hour  Intake           1431.5 ml  Output              300 ml  Net           1131.5 ml     Physical Exam  Gen: not in distress HEENT:  moist mucosa, Oral thrushResolved. Chest: Clear breath sounds bilaterally CVS:  S1&S2 tachycardic, no murmurs GI: soft, NT, ND, BS+ Musculoskeletal: warm, no edema     Data Review:    CBC  Recent Labs Lab 12/12/15 2210 12/15/15 0615 12/17/15 0413  WBC 13.8* 12.7* 9.6  HGB 12.0 9.4* 8.6*  HCT 39.8 31.2* 30.2*  PLT PLATELETS APPEAR ADEQUATE 465* 459*  MCV 69.2* 70.1* 70.1*  MCH 20.9* 21.1* 20.0*  MCHC 30.2 30.1 28.5*  RDW 16.8* 17.2* 17.2*  LYMPHSABS 1.9  --   --   MONOABS 1.4*  --   --   EOSABS 0.0  --   --   BASOSABS 0.0  --   --     Chemistries   Recent Labs Lab 12/12/15 2210 12/14/15 0549 12/16/15 0551 12/17/15 0413  NA 132* 134* 136 138  K 4.0 3.8 3.9 3.6  CL 96* 100* 104 102  CO2 GLUCOSE 150* 100* 108* 115*  BUN <5* <5* <5* <5*  CREATININE 0.55 0.47 0.44 0.49  CALCIUM 9.3 8.2* 8.5* 8.5*  MG  --  1.9  --   --   AST 19  --   --   --   ALT 13*  --   --   --   ALKPHOS 83  --   --   --   BILITOT 0.4  --   --   --    ------------------------------------------------------------------------------------------------------------------ No results for input(s): CHOL, HDL, LDLCALC, TRIG, CHOLHDL, LDLDIRECT in the last 72 hours.  No results found for: HGBA1C ------------------------------------------------------------------------------------------------------------------ No results for input(s): TSH, T4TOTAL, T3FREE, THYROIDAB in the last 72 hours.  Invalid input(s):  FREET3 ------------------------------------------------------------------------------------------------------------------ No results for input(s): VITAMINB12, FOLATE, FERRITIN, TIBC, IRON, RETICCTPCT in the last 72 hours.  Coagulation profile  Recent Labs Lab 12/13/15 0118  INR 1.38    No results for input(s): DDIMER in the last 72 hours.  Cardiac Enzymes No results for input(s): CKMB, TROPONINI, MYOGLOBIN in the last 168 hours.  Invalid input(s): CK ------------------------------------------------------------------------------------------------------------------ No results found for: BNP  Inpatient Medications  Scheduled Meds: . cefTAZidime (FORTAZ)  IV  2 g Intravenous Q8H  . Chlorhexidine Gluconate Cloth  6 each Topical Q0600  . dextromethorphan-guaiFENesin  1 tablet Oral BID  . dornase alpha  2.5 mg Nebulization Daily  . doxycycline  100 mg Oral Q12H  . enoxaparin (LOVENOX) injection  30 mg Subcutaneous Daily  . feeding supplement (ENSURE ENLIVE)  237 mL Oral Q1500  . fluconazole  200 mg Oral Daily  . levalbuterol  1.25 mg Nebulization Q6H  . lipase/protease/amylase (CREON) 120,000 Units  120,000 Units Oral TID WC  . mupirocin ointment  1 application Nasal BID  . pantoprazole  40 mg Oral Daily  . Sodium Chloride (Inhalant)  1 ampule Inhalation BID  . tobramycin  460 mg Intravenous Q24H   Continuous Infusions: . sodium chloride 100 mL/hr at 12/17/15 0550   PRN Meds:.acetaminophen, lipase/protease/amylase, menthol-cetylpyridinium, ondansetron, sodium chloride flush  Micro Results Recent Results (from the past 240 hour(s))  Blood Culture (routine x 2)     Status: None (Preliminary result)   Collection Time: 12/13/15 12:00 AM  Result Value Ref Range Status   Specimen Description BLOOD LEFT ARM  Final   Special Requests BOTTLES DRAWN AEROBIC AND ANAEROBIC  Final   Culture NO GROWTH 4 DAYS  Final   Report Status PENDING  Incomplete  Blood Culture (routine x 2)      Status: None (Preliminary result)   Collection Time: 12/13/15 12:08 AM  Result Value Ref Range Status   Specimen Description BLOOD LEFT ARM  Final   Special Requests IN PEDIATRIC BOTTLE  Final   Culture NO GROWTH 4 DAYS  Final   Report Status PENDING  Incomplete  Culture, respiratory (NON-Expectorated)     Status: None   Collection Time: 12/13/15 12:21 AM  Result Value Ref Range Status   Specimen Description TRACHEAL ASPIRATE  Final   Special Requests NONE  Final   Gram Stain   Final    FEW WBC PRESENT, PREDOMINANTLY PMN ABUNDANT GRAM POSITIVE COCCI IN CLUSTERS IN PAIRS MODERATE GRAM NEGATIVE COCCOBACILLI    Culture MULTIPLE ORGANISMS PRESENT, NONE PREDOMINANT  Final   Report Status 12/15/2015 FINAL  Final  Urine culture     Status: Abnormal   Collection Time: 12/13/15  1:01 AM  Result Value Ref Range Status   Specimen Description URINE, RANDOM  Final   Special Requests NONE  Final   Culture MULTIPLE SPECIES PRESENT, SUGGEST RECOLLECTION (A)  Final   Report Status 12/14/2015 FINAL  Final  MRSA PCR Screening     Status: Abnormal   Collection Time: 12/13/15  2:13 AM  Result Value Ref Range Status   MRSA by PCR POSITIVE (A) NEGATIVE Final    Comment:        The GeneXpert MRSA Assay (FDA approved for NASAL specimens only), is one component of a comprehensive MRSA colonization surveillance program. It is not intended to diagnose MRSA infection nor to guide or monitor treatment for MRSA infections. RESULT CALLED TO, READ BACK BY AND VERIFIED WITH: M WRIGHT,RN @0826  12/13/15 MKELLY   Respiratory Panel by PCR     Status: None   Collection Time: 12/13/15  4:42 AM  Result Value Ref Range Status   Adenovirus NOT DETECTED NOT DETECTED Final   Coronavirus 229E NOT DETECTED NOT DETECTED Final   Coronavirus HKU1 NOT DETECTED NOT DETECTED Final   Coronavirus NL63 NOT DETECTED NOT DETECTED Final   Coronavirus OC43 NOT DETECTED NOT DETECTED Final   Metapneumovirus NOT DETECTED  NOT DETECTED Final  Rhinovirus / Enterovirus NOT DETECTED NOT DETECTED Final   Influenza A NOT DETECTED NOT DETECTED Final   Influenza A H1 NOT DETECTED NOT DETECTED Final   Influenza A H1 2009 NOT DETECTED NOT DETECTED Final   Influenza A H3 NOT DETECTED NOT DETECTED Final   Influenza B NOT DETECTED NOT DETECTED Final   Parainfluenza Virus 1 NOT DETECTED NOT DETECTED Final   Parainfluenza Virus 2 NOT DETECTED NOT DETECTED Final   Parainfluenza Virus 3 NOT DETECTED NOT DETECTED Final   Parainfluenza Virus 4 NOT DETECTED NOT DETECTED Final   Respiratory Syncytial Virus NOT DETECTED NOT DETECTED Final   Bordetella pertussis NOT DETECTED NOT DETECTED Final   Chlamydophila pneumoniae NOT DETECTED NOT DETECTED Final   Mycoplasma pneumoniae NOT DETECTED NOT DETECTED Final  Culture, expectorated sputum-assessment     Status: None   Collection Time: 12/16/15  9:33 PM  Result Value Ref Range Status   Specimen Description SPUTUM  Final   Special Requests Immunocompromised  Final   Sputum evaluation   Final    THIS SPECIMEN IS ACCEPTABLE. RESPIRATORY CULTURE REPORT TO FOLLOW.   Report Status 12/17/2015 FINAL  Final  Culture, respiratory (NON-Expectorated)     Status: None (Preliminary result)   Collection Time: 12/16/15  9:33 PM  Result Value Ref Range Status   Specimen Description SPUTUM  Final   Special Requests NONE  Final   Gram Stain PENDING  Incomplete   Culture NO GROWTH 1 DAY  Final   Report Status PENDING  Incomplete    Radiology Reports Dg Chest 2 View  Result Date: 12/12/2015 CLINICAL DATA:  20 year old female with fever cough and left-sided chest pain. History of cystic fibrosis. EXAM: CHEST  2 VIEW COMPARISON:  Chest radiograph dated 11/16/2011 FINDINGS: There are chronic interstitial coarsening and prominence with areas of the cystic changes throughout the lungs compatible with chronic changes of cystic fibrosis. There has been interval increased in the diffuse interstitial  coarsening and bronchiectatic changes are with wall thickened walls compared to the prior study. Superimposed pneumonia is not excluded. Clinical correlation is recommended. There is no focal consolidation. No pleural effusion or pneumothorax noted. The cardiac silhouette is within normal limits. No acute osseous pathology identified. IMPRESSION: Chronic changes of cystic fibrosis with interval progression of interstitial scarring and bronchiectatic changes compared to the prior study. No focal consolidation. Electronically Signed   By: Elgie Collard M.D.   On: 12/12/2015 22:37  Dg Chest Port 1 View  Result Date: 12/17/2015 CLINICAL DATA:  Aspiration pneumonia.  Cystic fibrosis. EXAM: PORTABLE CHEST 1 VIEW COMPARISON:  12/12/2015 and 11/16/2011 chest radiographs FINDINGS: Chronic interstitial changes throughout both lungs again noted and compatible with cystic fibrosis changes. Bilateral upper lung scarring again noted. No definite evidence of airspace disease, pneumothorax or pleural effusions. A right PICC line is present with tip overlying the lower SVC. No acute bony abnormalities are present. IMPRESSION: Chronic interstitial lung disease compatible with history of cystic fibrosis. Superimposed aspiration or pneumonia is difficult to entirely exclude. Right PICC line with tip overlying the lower SVC. Electronically Signed   By: Harmon Pier M.D.   On: 12/17/2015 07:50   Time Spent in minutes  25   Eddie North M.D on 12/17/2015 at 1:56 PM  Between 7am to 7pm - Pager - [phone]  After 7pm go to [email or web address] - password [facility]  [facility] -  Office  [phone]

## 2015-12-17 NOTE — Progress Notes (Signed)
Pt wishes to use [facility] in [place] for home health needs at discharge per pt number is [phone]

## 2015-12-18 DIAGNOSIS — D509 Iron deficiency anemia, unspecified: Secondary | ICD-10-CM

## 2015-12-18 DIAGNOSIS — J690 Pneumonitis due to inhalation of food and vomit: Secondary | ICD-10-CM | POA: Diagnosis present

## 2015-12-18 LAB — CULTURE, BLOOD (ROUTINE X 2)
Culture: NO GROWTH
Culture: NO GROWTH

## 2015-12-18 MED ORDER — FLUCONAZOLE 200 MG PO TABS: 200.0000 mg | ORAL_TABLET | Freq: Every day | ORAL | 0 refills | Status: AC

## 2015-12-18 MED ORDER — ENSURE ENLIVE PO LIQD: 237.0000 mL | Freq: Every day | ORAL | 12 refills | Status: AC

## 2015-12-18 MED ORDER — TOBRAMYCIN SULFATE 80 MG/2ML IJ SOLN: 460.0000 mg | INTRAVENOUS | 0 refills | Status: AC

## 2015-12-18 MED ORDER — DEXTROSE 5 % IV SOLN: 2.0000 g | Freq: Three times a day (TID) | INTRAVENOUS | 0 refills | Status: AC

## 2015-12-18 MED ORDER — HEPARIN SOD (PORK) LOCK FLUSH 100 UNIT/ML IV SOLN
250.0000 [IU] | INTRAVENOUS | Status: AC | PRN
  Administered 2015-12-18: 250 [IU]

## 2015-12-18 NOTE — Progress Notes (Signed)
Pt being discharged home via wheelchair with family. Pt alert and oriented x4. VSS. Pt c/o no pain at this time. No signs of respiratory distress. Education complete and care plans resolved. Pt left with PICC line for IV abx with HH. No further issues at this time. Pt to follow up with PCP. Jillyn Hidden, RN

## 2015-12-18 NOTE — Discharge Summary (Signed)
Physician Discharge Summary  ALAYSIAH BROWDER ZOX:[id number] DOB: 1995/09/05 DOA: 12/12/2015  PCP: Maxwell Caul, MD  Admit date: 12/12/2015 Discharge date: 12/18/2015  Admitted From: HOME Disposition:  HOME  Recommendations for Outpatient Follow-up:  1. Follow up pulmonologist at [facility] in 2 weeks) 2.  completes 2 weeks course of IV abx on 12/31/2015. HH instructed to check renal function at least once weekly while on abx and send results to her pulmonologist.  Home Health: RN for IV infusion Equipment/Devices: none  Discharge Condition:stable CODE STATUS:full code Diet recommendation: regular with supplements   Discharge Diagnoses:  Principal Problem:   Acute respiratory failure with hypoxia (HCC)  Active Problems:   SIRS   Cystic fibrosis with pulmonary exacerbation (HCC)   Microcytic anemia   Sepsis (HCC)   Protein-calorie malnutrition, severe   Aspiration pneumonia (HCC)  Brief narrative 20 year old female with history of cystic fibrosis who follows at [facility], history of left pneumothorax and recently had all 4 wisdom tooth removed one week back presented to the ED with productive cough, shortness of breath and chest tightness in the past 3-4 days. She also reported subjective fever with chills. Patient had severe bouts of coughing with 2 episode of vomiting. Denied any headache, abdominal pain, diarrhea, dysuria. Patient was found to have SIRS with sinus tachycardia, WBC of 13.8 and O2 sat of 90% on room air. Remaining labs are stable. Chest excisional chronic cystic fibrosis changes with interval progression with interstitial scarring and bronchitic changes, no focal consolidation. Patient admitted to telemetry.  Principal Problem:   Acute respiratory failure with hypoxia (HCC) Possibly cystic fibrosis flareup versus aspiration pneumonia from recent wisdom teeth removal. Patient reports coughing and may have aspirated during the procedure. -Appreciate pulmonary  recommendations. Antibiotic switched to ceftazidime and tobramycin with 2 weeks course. PICC line placed. -Blood cultures negative. Repeat sputum culture sent. given supportive care and home nebulizer and inhaler. Discontinued tobramycin inhaler due to risk of renal toxicity on 2 aminoglycosides. Patient will follow up with her pulmonologist in about 2 weeks.   Active Problems:   Hx of cystic fibrosis Resumed home medications  Appreciate pulmonary follow-up.  Sinus tachycardia Both patient and her mother report she has significant tachycardia during hospital visit that potentially is triggered with anxiety. Currently stable and off fluids.  Oral thrush Improved with fluconazole. Will complete a 7 day course on 8/2.     Microcytic anemia Stable. Follow as outpatient. Pt may  need iron supplements   severe protein calorie malnutrition (HCC) Added supplements  Code Status : Full code  Family Communication  : Family at bedside. Mother updated on the phone  Disposition Plan  : Home  Consults  :  None  Procedures  : None   Discharge Instructions     Medication List    STOP taking these medications   azithromycin 500 MG tablet Commonly known as:  ZITHROMAX   tobramycin (PF) 300 MG/5ML nebulizer solution Commonly known as:  TOBI     TAKE these medications   ADEKS PO Take 1 tablet by mouth 2 (two) times daily.   albuterol 108 (90 Base) MCG/ACT inhaler Commonly known as:  PROVENTIL HFA;VENTOLIN HFA Inhale 2 puffs into the lungs 2 (two) times daily.   cefTAZidime 2 g in dextrose 5 % 50 mL Inject 2 g into the vein every 8 (eight) hours.   CREON 24000 units Cpep Generic drug:  Pancrelipase (Lip-Prot-Amyl) Take 3-5 capsules by mouth 3 (three) times daily with meals. 5  capsules with each meal and 3 capsules with snacks   dornase alpha 1 MG/ML nebulizer solution Commonly known as:  PULMOZYME Take 2.5 mg by nebulization daily.   feeding supplement  (ENSURE ENLIVE) Liqd Take 237 mLs by mouth daily at 3 pm.   fluconazole 200 MG tablet Commonly known as:  DIFLUCAN Take 1 tablet (200 mg total) by mouth daily.   omeprazole 20 MG capsule Commonly known as:  PRILOSEC Take 20 mg by mouth 2 (two) times daily before a meal.   Sodium Chloride (Inhalant) 7 % Nebu Inhale 1 ampule into the lungs 2 (two) times daily.   tobramycin 460 mg in dextrose 5 % 100 mL Inject 460 mg into the vein daily.      Follow-up Information    Maxwell Caul, MD Follow up in 2 week(s).   Specialty:  Student Contact information: [address] [place] [address] [phone]          No Known Allergies  Consultations:  pulmonary   Procedures/Studies: Dg Chest 2 View  Result Date: 12/12/2015 CLINICAL DATA:  20 year old female with fever cough and left-sided chest pain. History of cystic fibrosis. EXAM: CHEST  2 VIEW COMPARISON:  Chest radiograph dated 11/16/2011 FINDINGS: There are chronic interstitial coarsening and prominence with areas of the cystic changes throughout the lungs compatible with chronic changes of cystic fibrosis. There has been interval increased in the diffuse interstitial coarsening and bronchiectatic changes are with wall thickened walls compared to the prior study. Superimposed pneumonia is not excluded. Clinical correlation is recommended. There is no focal consolidation. No pleural effusion or pneumothorax noted. The cardiac silhouette is within normal limits. No acute osseous pathology identified. IMPRESSION: Chronic changes of cystic fibrosis with interval progression of interstitial scarring and bronchiectatic changes compared to the prior study. No focal consolidation. Electronically Signed   By: Elgie Collard M.D.   On: 12/12/2015 22:37  Dg Chest Port 1 View  Result Date: 12/17/2015 CLINICAL DATA:  Aspiration pneumonia.  Cystic fibrosis. EXAM: PORTABLE CHEST 1 VIEW COMPARISON:  12/12/2015 and 11/16/2011 chest  radiographs FINDINGS: Chronic interstitial changes throughout both lungs again noted and compatible with cystic fibrosis changes. Bilateral upper lung scarring again noted. No definite evidence of airspace disease, pneumothorax or pleural effusions. A right PICC line is present with tip overlying the lower SVC. No acute bony abnormalities are present. IMPRESSION: Chronic interstitial lung disease compatible with history of cystic fibrosis. Superimposed aspiration or pneumonia is difficult to entirely exclude. Right PICC line with tip overlying the lower SVC. Electronically Signed   By: Harmon Pier M.D.   On: 12/17/2015 07:50   RUE PICC on 7/30   Subjective: feels better overall. Afebrile past 36 hrs  Discharge Exam: Vitals:   12/18/15 0604 12/18/15 0850  BP: (!) 88/56 101/60  Pulse: (!) 108 (!) 111  Resp: 19 18  Temp: 98.7 F (37.1 C) 98.3 F (36.8 C)   Vitals:   12/18/15 0808 12/18/15 0818 12/18/15 0828 12/18/15 0850  BP:    101/60  Pulse:    (!) 111  Resp:    18  Temp:    98.3 F (36.8 C)  TempSrc:    Oral  SpO2: 96% 97% 94% 99%  Weight:      Height:         Gen: not in distress HEENT:  moist mucosa, Oral thrush Resolved. Chest: Clear breath sounds bilaterally CVS:  S1&S2 tachycardic, no murmurs GI: soft, NT, ND, BS+ Musculoskeletal: warm, no  edema, RUE PICC    The results of significant diagnostics from this hospitalization (including imaging, microbiology, ancillary and laboratory) are listed below for reference.     Microbiology: Recent Results (from the past 240 hour(s))  Blood Culture (routine x 2)     Status: None (Preliminary result)   Collection Time: 12/13/15 12:00 AM  Result Value Ref Range Status   Specimen Description BLOOD LEFT ARM  Final   Special Requests BOTTLES DRAWN AEROBIC AND ANAEROBIC  Final   Culture NO GROWTH 4 DAYS  Final   Report Status PENDING  Incomplete  Blood Culture (routine x 2)     Status: None (Preliminary result)    Collection Time: 12/13/15 12:08 AM  Result Value Ref Range Status   Specimen Description BLOOD LEFT ARM  Final   Special Requests IN PEDIATRIC BOTTLE  Final   Culture NO GROWTH 4 DAYS  Final   Report Status PENDING  Incomplete  Culture, respiratory (NON-Expectorated)     Status: None   Collection Time: 12/13/15 12:21 AM  Result Value Ref Range Status   Specimen Description TRACHEAL ASPIRATE  Final   Special Requests NONE  Final   Gram Stain   Final    FEW WBC PRESENT, PREDOMINANTLY PMN ABUNDANT GRAM POSITIVE COCCI IN CLUSTERS IN PAIRS MODERATE GRAM NEGATIVE COCCOBACILLI    Culture MULTIPLE ORGANISMS PRESENT, NONE PREDOMINANT  Final   Report Status 12/15/2015 FINAL  Final  Urine culture     Status: Abnormal   Collection Time: 12/13/15  1:01 AM  Result Value Ref Range Status   Specimen Description URINE, RANDOM  Final   Special Requests NONE  Final   Culture MULTIPLE SPECIES PRESENT, SUGGEST RECOLLECTION (A)  Final   Report Status 12/14/2015 FINAL  Final  MRSA PCR Screening     Status: Abnormal   Collection Time: 12/13/15  2:13 AM  Result Value Ref Range Status   MRSA by PCR POSITIVE (A) NEGATIVE Final    Comment:        The GeneXpert MRSA Assay (FDA approved for NASAL specimens only), is one component of a comprehensive MRSA colonization surveillance program. It is not intended to diagnose MRSA infection nor to guide or monitor treatment for MRSA infections. RESULT CALLED TO, READ BACK BY AND VERIFIED WITH: M [facility] @0826  12/13/15 MKELLY   Respiratory Panel by PCR     Status: None   Collection Time: 12/13/15  4:42 AM  Result Value Ref Range Status   Adenovirus NOT DETECTED NOT DETECTED Final   Coronavirus 229E NOT DETECTED NOT DETECTED Final   Coronavirus HKU1 NOT DETECTED NOT DETECTED Final   Coronavirus NL63 NOT DETECTED NOT DETECTED Final   Coronavirus OC43 NOT DETECTED NOT DETECTED Final   Metapneumovirus NOT DETECTED NOT DETECTED Final   Rhinovirus /  Enterovirus NOT DETECTED NOT DETECTED Final   Influenza A NOT DETECTED NOT DETECTED Final   Influenza A H1 NOT DETECTED NOT DETECTED Final   Influenza A H1 2009 NOT DETECTED NOT DETECTED Final   Influenza A H3 NOT DETECTED NOT DETECTED Final   Influenza B NOT DETECTED NOT DETECTED Final   Parainfluenza Virus 1 NOT DETECTED NOT DETECTED Final   Parainfluenza Virus 2 NOT DETECTED NOT DETECTED Final   Parainfluenza Virus 3 NOT DETECTED NOT DETECTED Final   Parainfluenza Virus 4 NOT DETECTED NOT DETECTED Final   Respiratory Syncytial Virus NOT DETECTED NOT DETECTED Final   Bordetella pertussis NOT DETECTED NOT DETECTED Final   Chlamydophila  pneumoniae NOT DETECTED NOT DETECTED Final   Mycoplasma pneumoniae NOT DETECTED NOT DETECTED Final  Culture, expectorated sputum-assessment     Status: None   Collection Time: 12/16/15  9:33 PM  Result Value Ref Range Status   Specimen Description SPUTUM  Final   Special Requests Immunocompromised  Final   Sputum evaluation   Final    THIS SPECIMEN IS ACCEPTABLE. RESPIRATORY CULTURE REPORT TO FOLLOW.   Report Status 12/17/2015 FINAL  Final  Culture, respiratory (NON-Expectorated)     Status: None (Preliminary result)   Collection Time: 12/16/15  9:33 PM  Result Value Ref Range Status   Specimen Description SPUTUM  Final   Special Requests NONE  Final   Gram Stain   Final    MODERATE WBC PRESENT, PREDOMINANTLY PMN RARE SQUAMOUS EPITHELIAL CELLS PRESENT NO ORGANISMS SEEN    Culture NO GROWTH 1 DAY  Final   Report Status PENDING  Incomplete     Labs: BNP (last 3 results) No results for input(s): BNP in the last 8760 hours. Basic Metabolic Panel:  Recent Labs Lab 12/12/15 2210 12/14/15 0549 12/16/15 0551 12/17/15 0413  NA 132* 134* 136 138  K 4.0 3.8 3.9 3.6  CL 96* 100* 104 102  CO2 25 26 24 29   GLUCOSE 150* 100* 108* 115*  BUN <5* <5* <5* <5*  CREATININE 0.55 0.47 0.44 0.49  CALCIUM 9.3 8.2* 8.5* 8.5*  MG  --  1.9  --   --     Liver Function Tests:  Recent Labs Lab 12/12/15 2210  AST 19  ALT 13*  ALKPHOS 83  BILITOT 0.4  PROT 9.0*  ALBUMIN 3.3*   No results for input(s): LIPASE, AMYLASE in the last 168 hours. No results for input(s): AMMONIA in the last 168 hours. CBC:  Recent Labs Lab 12/12/15 2210 12/15/15 0615 12/17/15 0413  WBC 13.8* 12.7* 9.6  NEUTROABS 10.5*  --   --   HGB 12.0 9.4* 8.6*  HCT 39.8 31.2* 30.2*  MCV 69.2* 70.1* 70.1*  PLT PLATELETS APPEAR ADEQUATE 465* 459*   Cardiac Enzymes: No results for input(s): CKTOTAL, CKMB, CKMBINDEX, TROPONINI in the last 168 hours. BNP: Invalid input(s): POCBNP CBG: No results for input(s): GLUCAP in the last 168 hours. D-Dimer No results for input(s): DDIMER in the last 72 hours. Hgb A1c No results for input(s): HGBA1C in the last 72 hours. Lipid Profile No results for input(s): CHOL, HDL, LDLCALC, TRIG, CHOLHDL, LDLDIRECT in the last 72 hours. Thyroid function studies No results for input(s): TSH, T4TOTAL, T3FREE, THYROIDAB in the last 72 hours.  Invalid input(s): FREET3 Anemia work up No results for input(s): VITAMINB12, FOLATE, FERRITIN, TIBC, IRON, RETICCTPCT in the last 72 hours. Urinalysis No results found for: COLORURINE, APPEARANCEUR, LABSPEC, PHURINE, GLUCOSEU, HGBUR, BILIRUBINUR, KETONESUR, PROTEINUR, UROBILINOGEN, NITRITE, LEUKOCYTESUR Sepsis Labs Invalid input(s): PROCALCITONIN,  WBC,  LACTICIDVEN Microbiology Recent Results (from the past 240 hour(s))  Blood Culture (routine x 2)     Status: None (Preliminary result)   Collection Time: 12/13/15 12:00 AM  Result Value Ref Range Status   Specimen Description BLOOD LEFT ARM  Final   Special Requests BOTTLES DRAWN AEROBIC AND ANAEROBIC  Final   Culture NO GROWTH 4 DAYS  Final   Report Status PENDING  Incomplete  Blood Culture (routine x 2)     Status: None (Preliminary result)   Collection Time: 12/13/15 12:08 AM  Result Value Ref Range Status   Specimen  Description BLOOD LEFT ARM  Final  Special Requests IN PEDIATRIC BOTTLE  Final   Culture NO GROWTH 4 DAYS  Final   Report Status PENDING  Incomplete  Culture, respiratory (NON-Expectorated)     Status: None   Collection Time: 12/13/15 12:21 AM  Result Value Ref Range Status   Specimen Description TRACHEAL ASPIRATE  Final   Special Requests NONE  Final   Gram Stain   Final    FEW WBC PRESENT, PREDOMINANTLY PMN ABUNDANT GRAM POSITIVE COCCI IN CLUSTERS IN PAIRS MODERATE GRAM NEGATIVE COCCOBACILLI    Culture MULTIPLE ORGANISMS PRESENT, NONE PREDOMINANT  Final   Report Status 12/15/2015 FINAL  Final  Urine culture     Status: Abnormal   Collection Time: 12/13/15  1:01 AM  Result Value Ref Range Status   Specimen Description URINE, RANDOM  Final   Special Requests NONE  Final   Culture MULTIPLE SPECIES PRESENT, SUGGEST RECOLLECTION (A)  Final   Report Status 12/14/2015 FINAL  Final  MRSA PCR Screening     Status: Abnormal   Collection Time: 12/13/15  2:13 AM  Result Value Ref Range Status   MRSA by PCR POSITIVE (A) NEGATIVE Final    Comment:        The GeneXpert MRSA Assay (FDA approved for NASAL specimens only), is one component of a comprehensive MRSA colonization surveillance program. It is not intended to diagnose MRSA infection nor to guide or monitor treatment for MRSA infections. RESULT CALLED TO, READ BACK BY AND VERIFIED WITH: M [facility] @0826  12/13/15 MKELLY   Respiratory Panel by PCR     Status: None   Collection Time: 12/13/15  4:42 AM  Result Value Ref Range Status   Adenovirus NOT DETECTED NOT DETECTED Final   Coronavirus 229E NOT DETECTED NOT DETECTED Final   Coronavirus HKU1 NOT DETECTED NOT DETECTED Final   Coronavirus NL63 NOT DETECTED NOT DETECTED Final   Coronavirus OC43 NOT DETECTED NOT DETECTED Final   Metapneumovirus NOT DETECTED NOT DETECTED Final   Rhinovirus / Enterovirus NOT DETECTED NOT DETECTED Final   Influenza A NOT DETECTED NOT  DETECTED Final   Influenza A H1 NOT DETECTED NOT DETECTED Final   Influenza A H1 2009 NOT DETECTED NOT DETECTED Final   Influenza A H3 NOT DETECTED NOT DETECTED Final   Influenza B NOT DETECTED NOT DETECTED Final   Parainfluenza Virus 1 NOT DETECTED NOT DETECTED Final   Parainfluenza Virus 2 NOT DETECTED NOT DETECTED Final   Parainfluenza Virus 3 NOT DETECTED NOT DETECTED Final   Parainfluenza Virus 4 NOT DETECTED NOT DETECTED Final   Respiratory Syncytial Virus NOT DETECTED NOT DETECTED Final   Bordetella pertussis NOT DETECTED NOT DETECTED Final   Chlamydophila pneumoniae NOT DETECTED NOT DETECTED Final   Mycoplasma pneumoniae NOT DETECTED NOT DETECTED Final  Culture, expectorated sputum-assessment     Status: None   Collection Time: 12/16/15  9:33 PM  Result Value Ref Range Status   Specimen Description SPUTUM  Final   Special Requests Immunocompromised  Final   Sputum evaluation   Final    THIS SPECIMEN IS ACCEPTABLE. RESPIRATORY CULTURE REPORT TO FOLLOW.   Report Status 12/17/2015 FINAL  Final  Culture, respiratory (NON-Expectorated)     Status: None (Preliminary result)   Collection Time: 12/16/15  9:33 PM  Result Value Ref Range Status   Specimen Description SPUTUM  Final   Special Requests NONE  Final   Gram Stain   Final    MODERATE WBC PRESENT, PREDOMINANTLY PMN RARE SQUAMOUS EPITHELIAL CELLS PRESENT  NO ORGANISMS SEEN    Culture NO GROWTH 1 DAY  Final   Report Status PENDING  Incomplete     Time coordinating discharge: Over 30 minutes  SIGNED:   Eddie North, MD  [facility] 12/18/2015, 10:14 AM Pager   If 7PM-7AM, please contact night-coverage [email or web address] Password [email or web address]

## 2015-12-19 LAB — CULTURE, RESPIRATORY W GRAM STAIN

## 2015-12-19 LAB — CULTURE, RESPIRATORY

## 2015-12-27 ENCOUNTER — Other Ambulatory Visit (HOSPITAL_COMMUNITY)
Admission: RE | Admit: 2015-12-27 | Discharge: 2015-12-27 | Disposition: A | Discharge status: Home / Self Care | Payer: 59 | Source: Other Acute Inpatient Hospital | Attending: Student | Admitting: Student

## 2015-12-27 DIAGNOSIS — Z792 Long term (current) use of antibiotics: Secondary | ICD-10-CM | POA: Insufficient documentation

## 2015-12-27 DIAGNOSIS — A419 Sepsis, unspecified organism: Secondary | ICD-10-CM | POA: Insufficient documentation

## 2015-12-27 DIAGNOSIS — J9691 Respiratory failure, unspecified with hypoxia: Secondary | ICD-10-CM | POA: Insufficient documentation

## 2015-12-27 LAB — COMPREHENSIVE METABOLIC PANEL
ALT: 46 U/L (ref 14–54)
AST: 44 U/L — ABNORMAL HIGH (ref 15–41)
Albumin: 3.5 g/dL (ref 3.5–5.0)
Alkaline Phosphatase: 63 U/L (ref 38–126)
Anion gap: 8 (ref 5–15)
BILIRUBIN TOTAL: 0.2 mg/dL — AB (ref 0.3–1.2)
BUN: 18 mg/dL (ref 6–20)
CHLORIDE: 100 mmol/L — AB (ref 101–111)
CO2: 27 mmol/L (ref 22–32)
CREATININE: 0.51 mg/dL (ref 0.44–1.00)
Calcium: 8.7 mg/dL — ABNORMAL LOW (ref 8.9–10.3)
Glucose, Bld: 90 mg/dL (ref 65–99)
POTASSIUM: 3.8 mmol/L (ref 3.5–5.1)
Sodium: 135 mmol/L (ref 135–145)
TOTAL PROTEIN: 8.6 g/dL — AB (ref 6.5–8.1)

## 2015-12-27 LAB — MAGNESIUM: MAGNESIUM: 1.8 mg/dL (ref 1.7–2.4)

## 2015-12-27 LAB — TOBRAMYCIN LEVEL, PEAK: TOBRAMYCIN PK: 28.8 ug/mL — AB (ref 5.0–10.0)

## 2015-12-29 ENCOUNTER — Inpatient Hospital Stay (HOSPITAL_COMMUNITY)
Admission: AD | Admit: 2015-12-29 | Discharge: 2015-12-29 | Discharge status: Absent without leave | Payer: 59 | Source: Ambulatory Visit | Attending: Family Medicine | Admitting: Family Medicine

## 2015-12-29 ENCOUNTER — Other Ambulatory Visit (HOSPITAL_COMMUNITY)
Admission: RE | Admit: 2015-12-29 | Discharge: 2015-12-29 | Disposition: A | Discharge status: Home / Self Care | Payer: 59 | Source: Ambulatory Visit | Attending: Physician Assistant | Admitting: Physician Assistant

## 2015-12-29 LAB — BASIC METABOLIC PANEL
ANION GAP: 8 (ref 5–15)
BUN: 13 mg/dL (ref 6–20)
CHLORIDE: 103 mmol/L (ref 101–111)
CO2: 26 mmol/L (ref 22–32)
Calcium: 8.7 mg/dL — ABNORMAL LOW (ref 8.9–10.3)
Creatinine, Ser: 0.67 mg/dL (ref 0.44–1.00)
GFR calc non Af Amer: >60 mL/min (ref >60)
Glucose, Bld: 95 mg/dL (ref 65–99)
POTASSIUM: 3.5 mmol/L (ref 3.5–5.1)
Sodium: 137 mmol/L (ref 135–145)

## 2015-12-29 LAB — MAGNESIUM: Magnesium: 2.1 mg/dL (ref 1.7–2.4)

## 2015-12-30 LAB — TOBRAMYCIN LEVEL, PEAK: TOBRAMYCIN PK: 24 ug/mL — AB (ref 5.0–10.0)

## 2016-01-04 ENCOUNTER — Other Ambulatory Visit (HOSPITAL_COMMUNITY)
Admission: RE | Admit: 2016-01-04 | Discharge: 2016-01-04 | Disposition: A | Discharge status: Home / Self Care | Payer: 59 | Source: Other Acute Inpatient Hospital | Attending: Student | Admitting: Student

## 2016-01-04 DIAGNOSIS — A419 Sepsis, unspecified organism: Secondary | ICD-10-CM | POA: Diagnosis present

## 2016-01-04 DIAGNOSIS — J9691 Respiratory failure, unspecified with hypoxia: Secondary | ICD-10-CM | POA: Insufficient documentation

## 2016-01-04 LAB — BUN: BUN: 25 mg/dL — ABNORMAL HIGH (ref 6–20)

## 2016-01-04 LAB — MAGNESIUM: MAGNESIUM: 2 mg/dL (ref 1.7–2.4)

## 2016-01-04 LAB — CREATININE, SERUM: CREATININE: 0.73 mg/dL (ref 0.44–1.00)

## 2016-01-04 LAB — TOBRAMYCIN LEVEL, PEAK: TOBRAMYCIN PK: 20.5 ug/mL — AB (ref 5.0–10.0)

## 2016-05-07 ENCOUNTER — Other Ambulatory Visit (HOSPITAL_COMMUNITY): Payer: Self-pay | Admitting: Pulmonary Disease

## 2016-05-07 DIAGNOSIS — B999 Unspecified infectious disease: Secondary | ICD-10-CM

## 2016-05-08 ENCOUNTER — Encounter (HOSPITAL_COMMUNITY): Payer: Self-pay | Admitting: Interventional Radiology

## 2016-05-08 ENCOUNTER — Other Ambulatory Visit (HOSPITAL_COMMUNITY): Payer: Self-pay | Admitting: Pulmonary Disease

## 2016-05-08 ENCOUNTER — Ambulatory Visit (HOSPITAL_COMMUNITY)
Admission: RE | Admit: 2016-05-08 | Discharge: 2016-05-08 | Disposition: A | Discharge status: Home / Self Care | Payer: 59 | Source: Ambulatory Visit | Attending: Pulmonary Disease | Admitting: Pulmonary Disease

## 2016-05-08 DIAGNOSIS — B999 Unspecified infectious disease: Secondary | ICD-10-CM

## 2016-05-08 HISTORY — PX: IR GENERIC HISTORICAL: IMG1180011

## 2016-05-08 MED ORDER — HEPARIN SOD (PORK) LOCK FLUSH 100 UNIT/ML IV SOLN
INTRAVENOUS | Status: AC
  Filled 2016-05-08: qty 5

## 2016-05-08 MED ORDER — LIDOCAINE HCL (PF) 1 % IJ SOLN
INTRAMUSCULAR | Status: AC
  Administered 2016-05-08: 10 mL
  Filled 2016-05-08: qty 30

## 2016-05-08 NOTE — Procedures (Signed)
R arm PowerPICC placed under US and fluoroscopy No ptx on spot chest radiograph. No complication No blood loss. See complete dictation in [facility] P[facility].  

## 2016-05-16 ENCOUNTER — Ambulatory Visit (HOSPITAL_COMMUNITY)
Admission: RE | Admit: 2016-05-16 | Discharge: 2016-05-16 | Disposition: A | Discharge status: Home / Self Care | Payer: 59 | Source: Ambulatory Visit | Attending: Pulmonary Disease | Admitting: Pulmonary Disease

## 2016-05-16 DIAGNOSIS — J479 Bronchiectasis, uncomplicated: Secondary | ICD-10-CM | POA: Diagnosis not present

## 2017-01-01 ENCOUNTER — Other Ambulatory Visit (HOSPITAL_COMMUNITY)
Admission: AD | Admit: 2017-01-01 | Discharge: 2017-01-01 | Disposition: A | Discharge status: Home / Self Care | Payer: 59 | Source: Ambulatory Visit | Attending: Physician Assistant | Admitting: Physician Assistant

## 2017-01-01 LAB — COMPREHENSIVE METABOLIC PANEL
ALT: 17 U/L (ref 14–54)
ANION GAP: 9 (ref 5–15)
AST: 17 U/L (ref 15–41)
Albumin: 4 g/dL (ref 3.5–5.0)
Alkaline Phosphatase: 82 U/L (ref 38–126)
BUN: 7 mg/dL (ref 6–20)
CHLORIDE: 101 mmol/L (ref 101–111)
CO2: 26 mmol/L (ref 22–32)
Calcium: 9.1 mg/dL (ref 8.9–10.3)
Creatinine, Ser: 0.61 mg/dL (ref 0.44–1.00)
Glucose, Bld: 90 mg/dL (ref 65–99)
POTASSIUM: 3.9 mmol/L (ref 3.5–5.1)
Sodium: 136 mmol/L (ref 135–145)
Total Bilirubin: 0.5 mg/dL (ref 0.3–1.2)
Total Protein: 9.2 g/dL — ABNORMAL HIGH (ref 6.5–8.1)

## 2017-01-01 LAB — GLUCOSE, 2 HOUR
GLUCOSE, 2 HOUR: 90 mg/dL (ref 70–139)
GLUCOSE, 2 HOUR: 157 mg/dL — AB (ref 70–139)

## 2017-01-01 LAB — PROTIME-INR
INR: 1.06
Prothrombin Time: 13.8 seconds (ref 11.4–15.2)

## 2017-01-01 LAB — GLUCOSE, FASTING: GLUCOSE, FASTING: 90 mg/dL (ref 65–99)

## 2017-01-02 LAB — HEMOGLOBIN A1C
Hgb A1c MFr Bld: 5.5 % (ref 4.8–5.6)
MEAN PLASMA GLUCOSE: 111 mg/dL

## 2017-01-02 LAB — VITAMIN D 25 HYDROXY (VIT D DEFICIENCY, FRACTURES): Vit D, 25-Hydroxy: 25.3 ng/mL — ABNORMAL LOW (ref 30.0–100.0)

## 2017-01-03 LAB — VITAMIN A: VITAMIN A (RETINOIC ACID): 22.9 ug/dL — AB (ref 31.2–89.1)

## 2017-01-03 LAB — IGE: IgE (Immunoglobulin E), Serum: 54 IU/mL (ref 0–100)

## 2017-01-03 LAB — VITAMIN E
Vitamin E (Alpha Tocopherol): 7.8 mg/L (ref 5.9–19.4)
Vitamin E(Gamma Tocopherol): 1.6 mg/L (ref 0.7–4.9)

## 2017-08-05 IMAGING — DX DG CHEST 2V
2 series · 2 of 2 positions shown · non-contrast
Comparison: Chest radiograph dated 11/16/2011

CLINICAL DATA: 20-year-old female with fever cough and left-sided
chest pain. History of cystic fibrosis.

EXAM:
CHEST  2 VIEW

[chest pa]
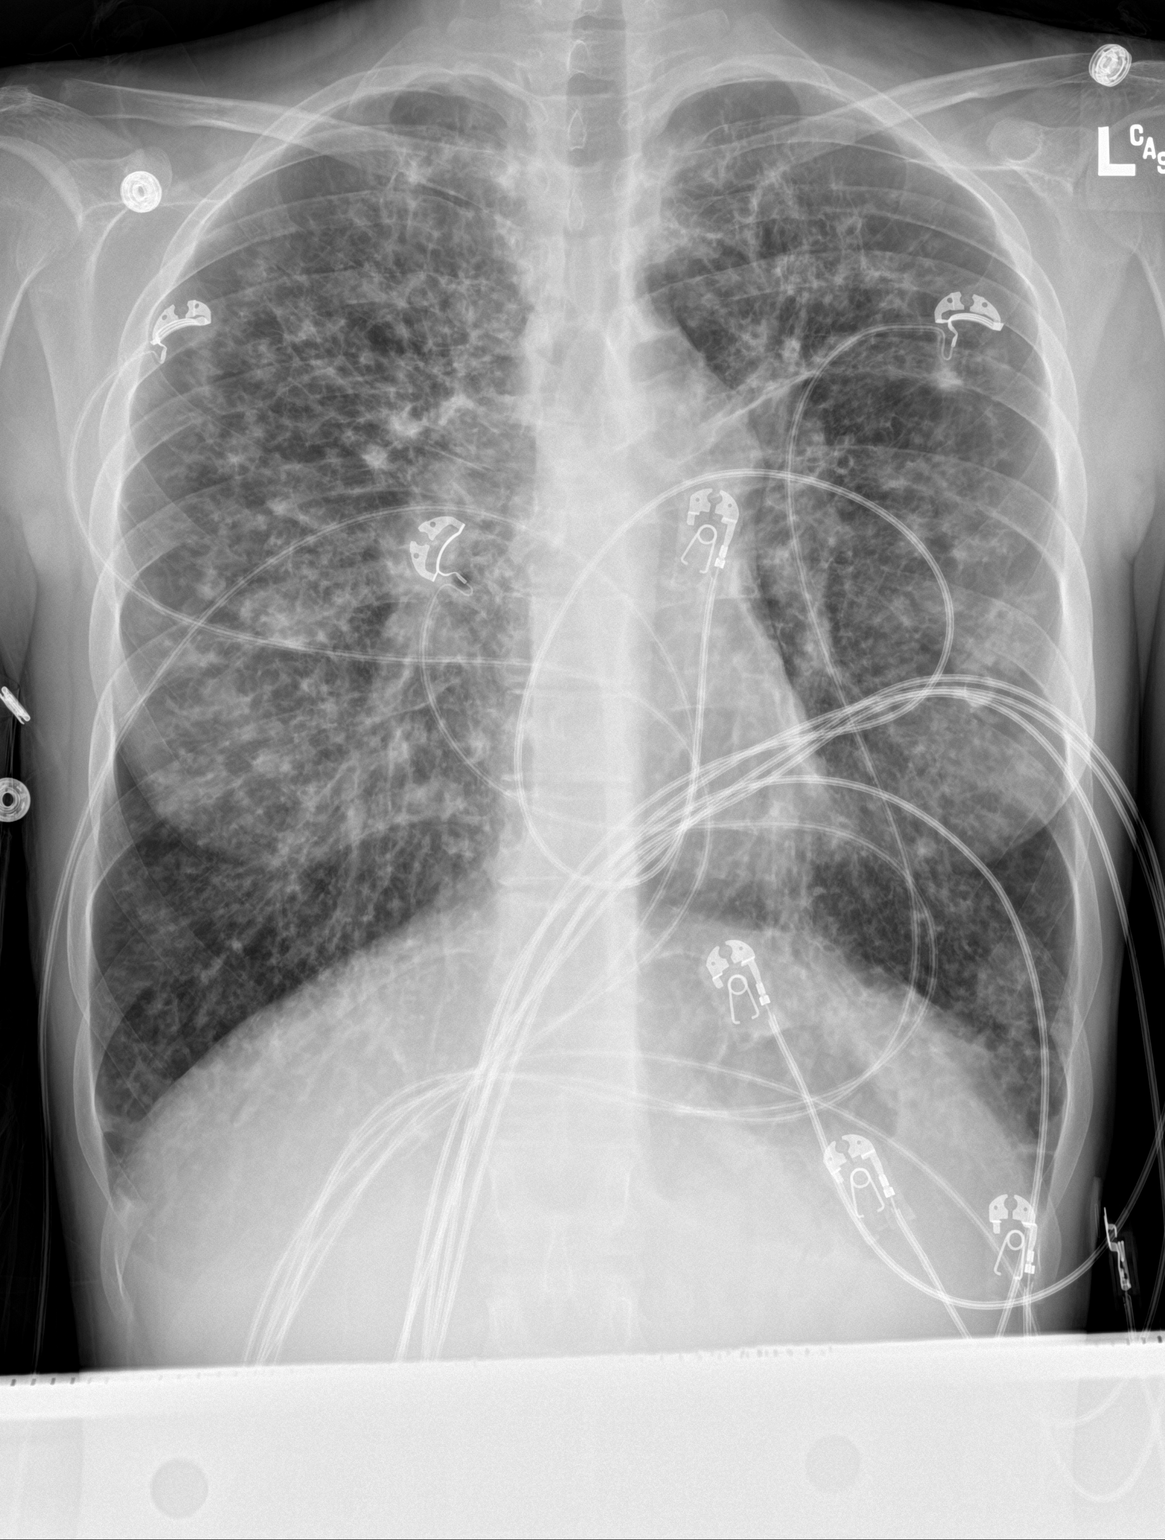

[chest lat]
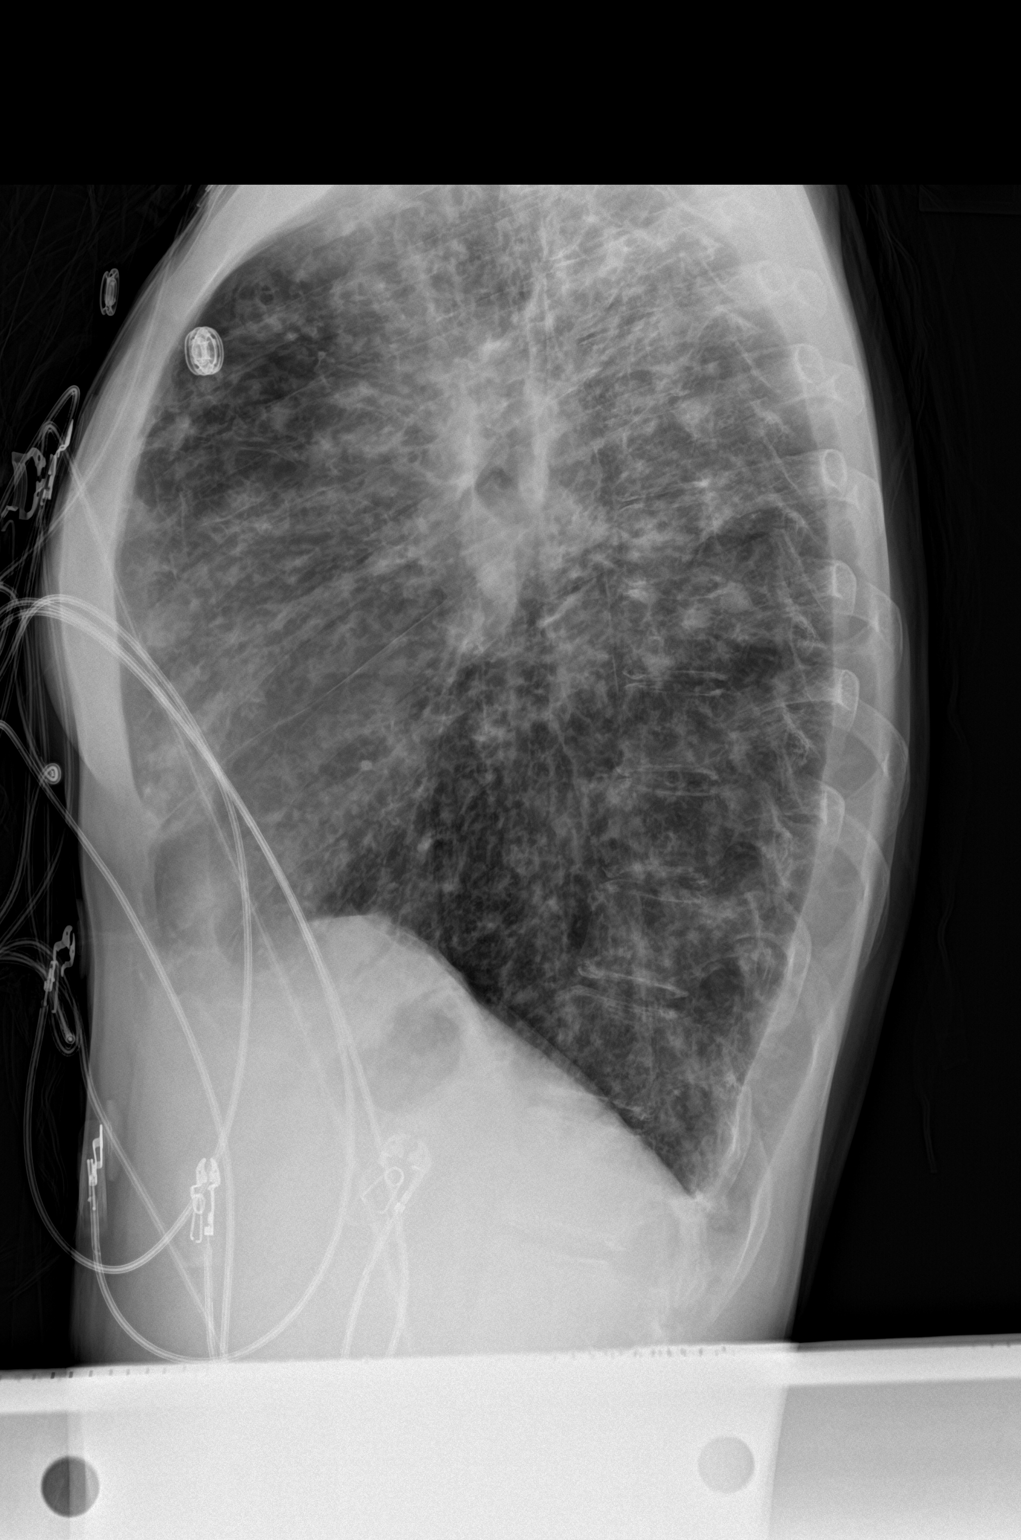

[2 of 2 positions shown; findings below may reference images not displayed]

FINDINGS: There are chronic interstitial coarsening and prominence with areas
of the cystic changes throughout the lungs compatible with chronic
changes of cystic fibrosis. There has been interval increased in the
diffuse interstitial coarsening and bronchiectatic changes are with
wall thickened walls compared to the prior study. Superimposed
pneumonia is not excluded. Clinical correlation is recommended.
There is no focal consolidation. No pleural effusion or pneumothorax
noted. The cardiac silhouette is within normal limits. No acute
osseous pathology identified.
IMPRESSION: Chronic changes of cystic fibrosis with interval progression of
interstitial scarring and bronchiectatic changes compared to the
prior study. No focal consolidation.

## 2017-08-10 IMAGING — CR DG CHEST 1V PORT
1 series · 1 of 1 positions shown · non-contrast
Comparison: 12/12/2015 and 11/16/2011 chest radiographs

CLINICAL DATA: Aspiration pneumonia.  Cystic fibrosis.

EXAM:
PORTABLE CHEST 1 VIEW

[AP]
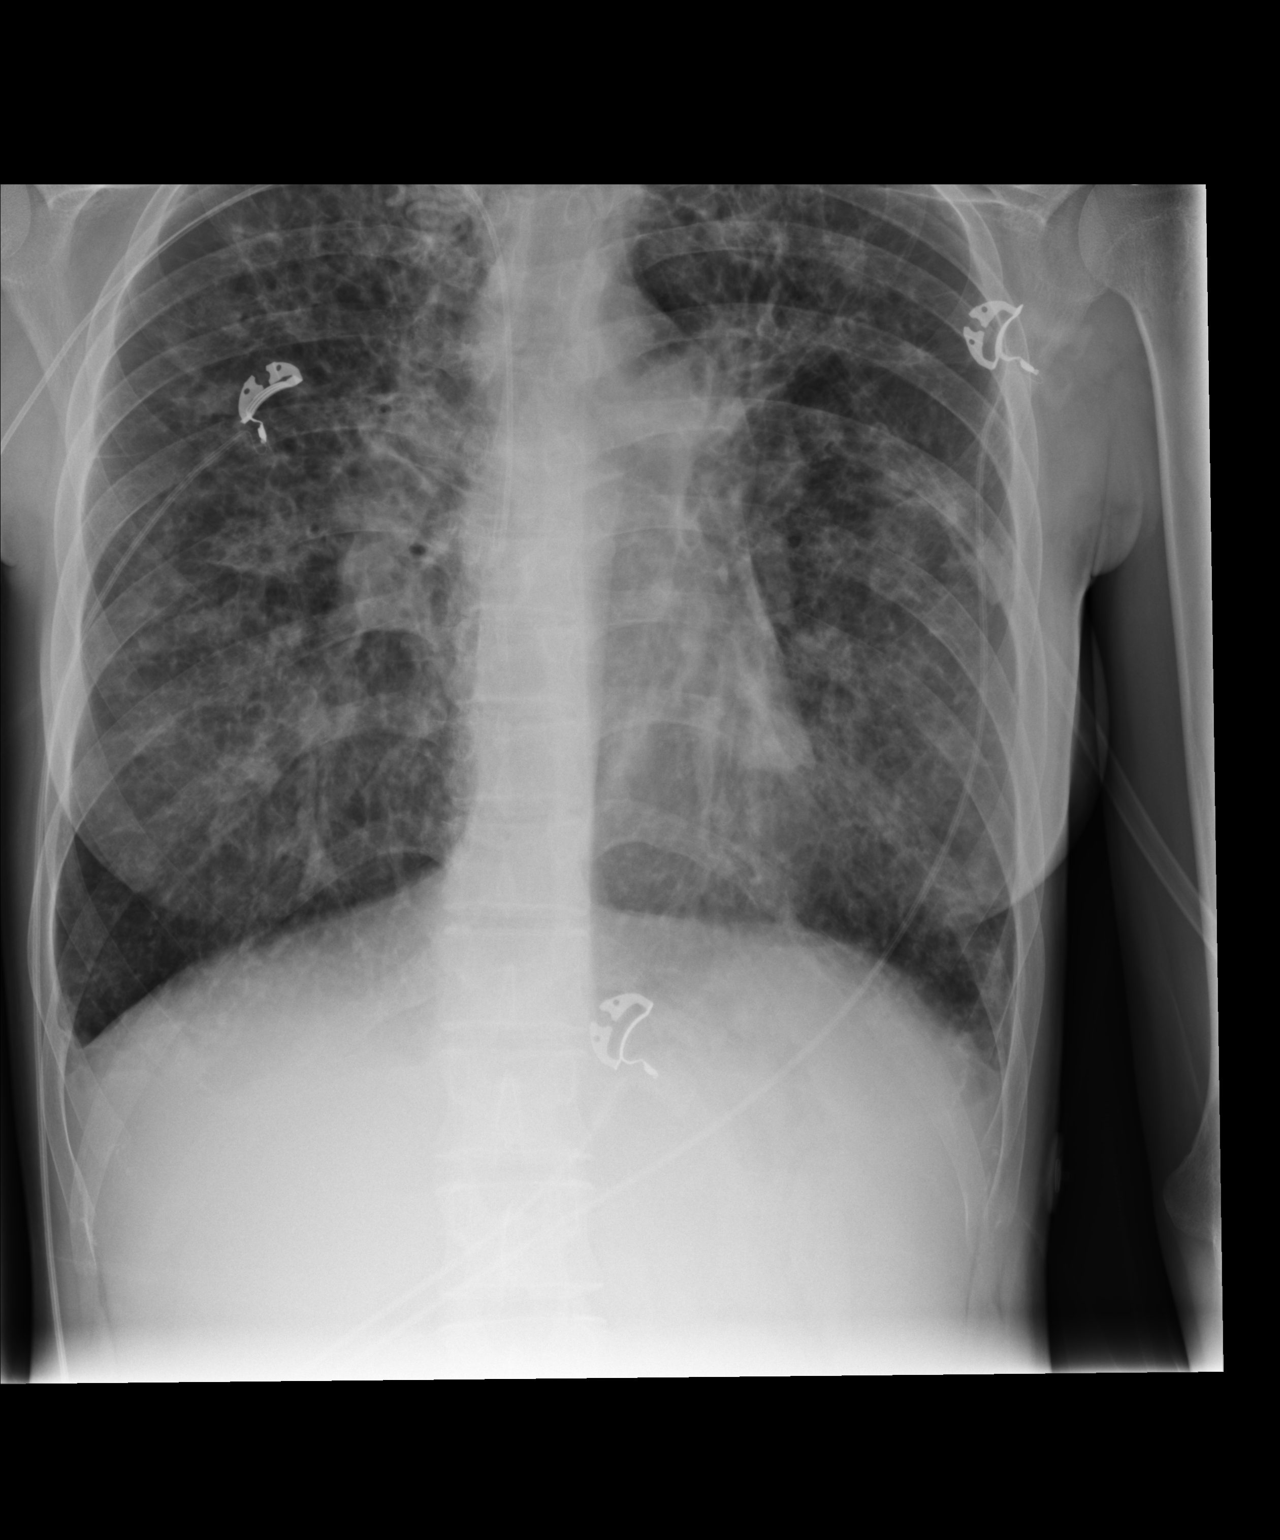

[1 of 1 positions shown; findings below may reference images not displayed]

FINDINGS: Chronic interstitial changes throughout both lungs again noted and
compatible with cystic fibrosis changes.

Bilateral upper lung scarring again noted.

No definite evidence of airspace disease, pneumothorax or pleural
effusions.

A right PICC line is present with tip overlying the lower SVC.

No acute bony abnormalities are present.
IMPRESSION: Chronic interstitial lung disease compatible with history of cystic
fibrosis. Superimposed aspiration or pneumonia is difficult to
entirely exclude.

Right PICC line with tip overlying the lower SVC.

## 2017-12-31 IMAGING — XA IR US GUIDE VASC ACCESS RIGHT
1 series · 2 of 2 positions shown · non-contrast
Comparison: none

CLINICAL DATA: Cystic fibrosis, needs durable venous access for
antibiotic regimen.

EXAM:
PICC PLACEMENT WITH ULTRASOUND AND FLUOROSCOPY
FLUOROSCOPY TIME:  0.6 minute (20 u3ymS DAP)
TECHNIQUE: After written informed consent was obtained, patient was placed in
the supine position on angiographic table. Patency of the right
brachial vein was confirmed with ultrasound with image
documentation. An appropriate skin site was determined. Skin site
was marked. Region was prepped using maximum barrier technique
including cap and mask, sterile gown, sterile gloves, large sterile
sheet, and Chlorhexidine as cutaneous antisepsis. The region was
infiltrated locally with 1% lidocaine. Under real-time ultrasound
guidance, the right brachial vein was accessed with a 21 gauge
micropuncture needle; the needle tip within the vein was confirmed
with ultrasound image documentation. Needle exchanged over a 018
guidewire for a peel-away sheath, through which a 5-French
single-lumen power injectable PICC trimmed to 36cm was advanced,
positioned with its tip near the cavoatrial junction. Spot chest
radiograph confirms appropriate catheter position. Catheter was
flushed per protocol and secured externally. The patient tolerated
procedure well.
COMPLICATIONS:
COMPLICATIONS
none

[Series 1: fl (-) angio · 2 of 2 slices shown]
[im 1/2]
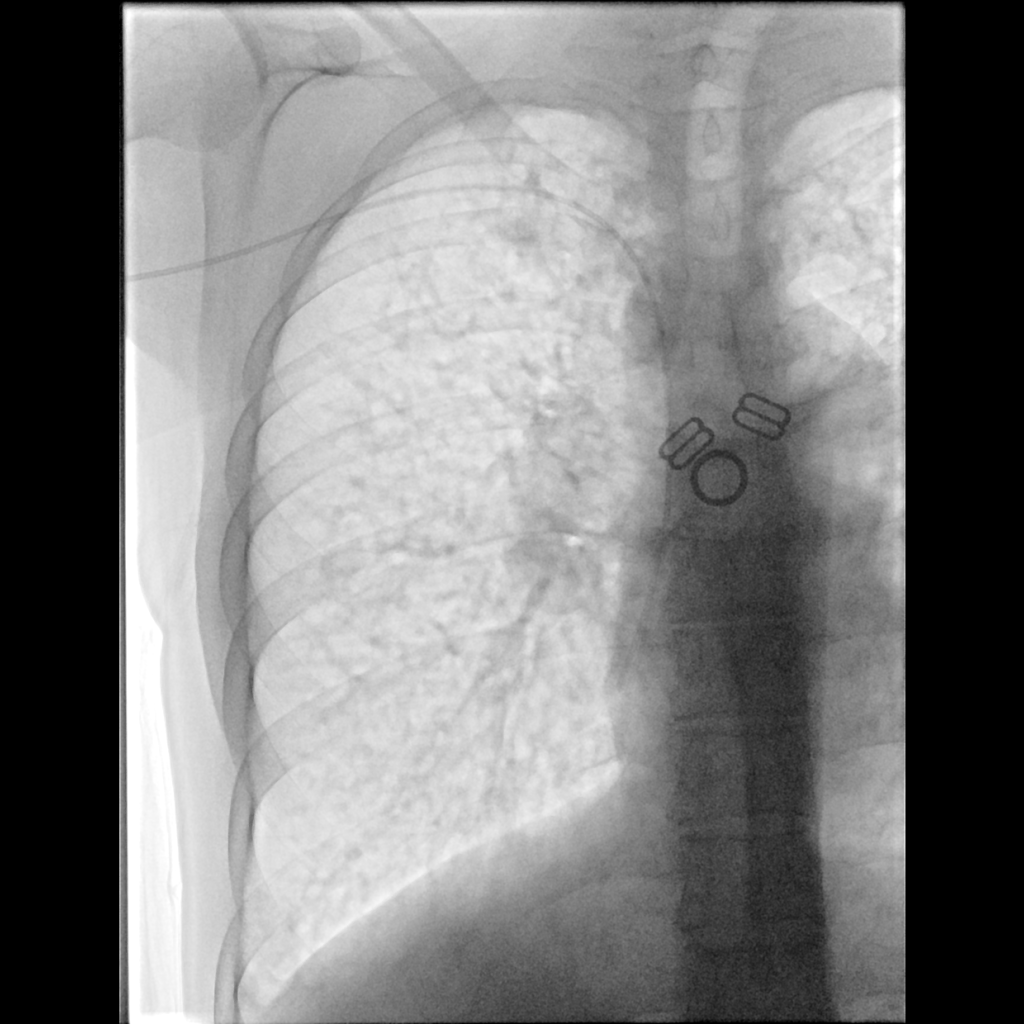
[im 2/2]
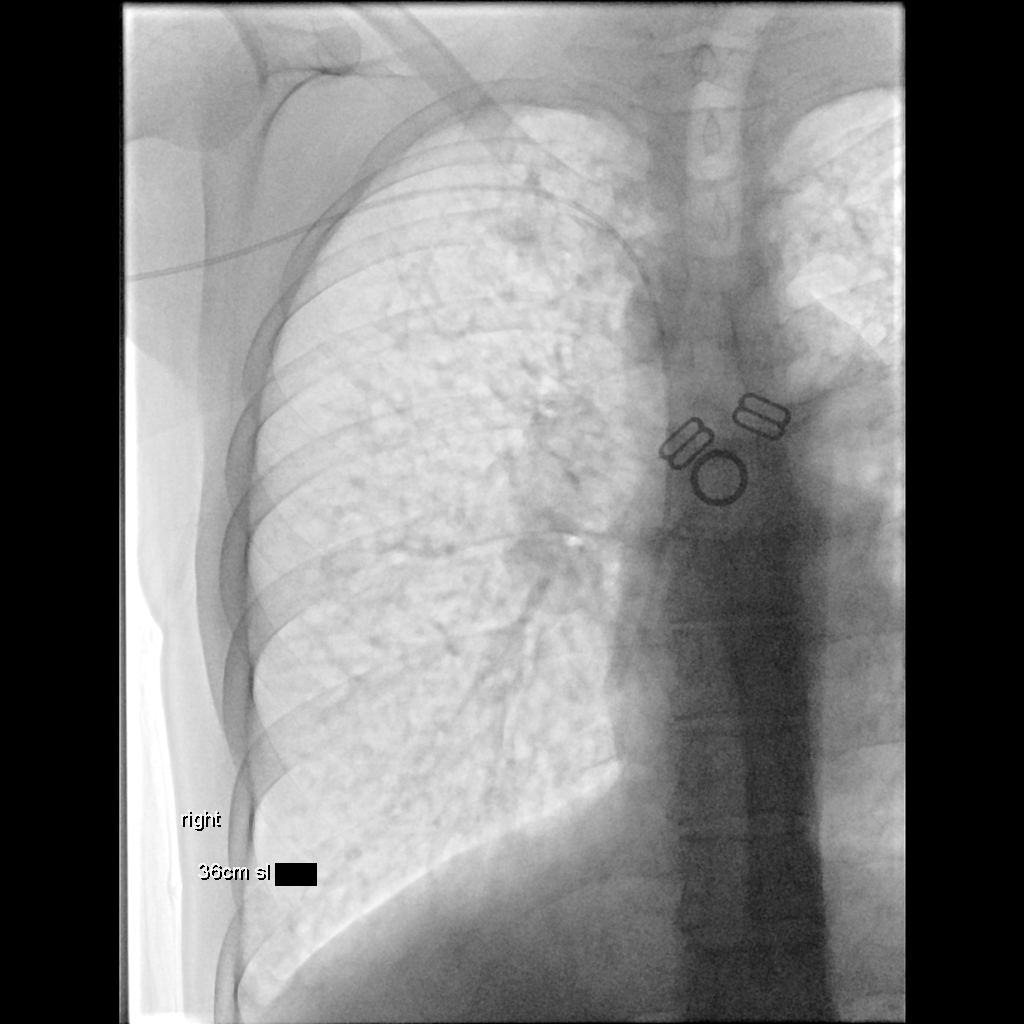

[2 of 2 positions shown; findings below may reference images not displayed]

IMPRESSION: 1. Technically successful five French single lumen power injectable
PICC placement
